# Patient Record
Sex: Female | Born: 1985 | Race: Black or African American | Hispanic: No | Marital: Single | Smoking: Former smoker
Health system: Southern US, Community
[De-identification: ages and names within clinical notes are randomized; demographics above are authoritative.]

## PROBLEM LIST (undated history)

## (undated) ENCOUNTER — Inpatient Hospital Stay (HOSPITAL_COMMUNITY): Payer: Self-pay

## (undated) DIAGNOSIS — A749 Chlamydial infection, unspecified: Secondary | ICD-10-CM

## (undated) DIAGNOSIS — J302 Other seasonal allergic rhinitis: Secondary | ICD-10-CM

## (undated) HISTORY — PX: APPENDECTOMY: SHX54

---
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
## 2006-01-02 ENCOUNTER — Ambulatory Visit (HOSPITAL_COMMUNITY): Payer: Self-pay | Admitting: Psychiatry

## 2006-07-05 ENCOUNTER — Inpatient Hospital Stay (HOSPITAL_COMMUNITY): Admission: AD | Admit: 2006-07-05 | Discharge: 2006-07-07 | Payer: Self-pay | Admitting: Family Medicine

## 2006-07-11 ENCOUNTER — Emergency Department (HOSPITAL_COMMUNITY): Admission: EM | Admit: 2006-07-11 | Discharge: 2006-07-11 | Payer: Self-pay | Admitting: Emergency Medicine

## 2006-09-25 ENCOUNTER — Emergency Department (HOSPITAL_COMMUNITY): Admission: EM | Admit: 2006-09-25 | Discharge: 2006-09-25 | Payer: Self-pay | Admitting: Family Medicine

## 2006-10-22 ENCOUNTER — Emergency Department (HOSPITAL_COMMUNITY): Admission: EM | Admit: 2006-10-22 | Discharge: 2006-10-22 | Payer: Self-pay | Admitting: Emergency Medicine

## 2006-11-27 ENCOUNTER — Emergency Department (HOSPITAL_COMMUNITY): Admission: EM | Admit: 2006-11-27 | Discharge: 2006-11-27 | Payer: Self-pay | Admitting: Family Medicine

## 2007-01-07 ENCOUNTER — Emergency Department (HOSPITAL_COMMUNITY): Admission: EM | Admit: 2007-01-07 | Discharge: 2007-01-07 | Payer: Self-pay | Admitting: Family Medicine

## 2008-05-13 ENCOUNTER — Inpatient Hospital Stay (HOSPITAL_COMMUNITY): Admission: AD | Admit: 2008-05-13 | Discharge: 2008-05-13 | Payer: Self-pay | Admitting: Obstetrics

## 2008-06-03 ENCOUNTER — Inpatient Hospital Stay (HOSPITAL_COMMUNITY): Admission: AD | Admit: 2008-06-03 | Discharge: 2008-06-03 | Payer: Self-pay | Admitting: Registered Nurse

## 2008-06-04 ENCOUNTER — Inpatient Hospital Stay (HOSPITAL_COMMUNITY): Admission: AD | Admit: 2008-06-04 | Discharge: 2008-06-07 | Payer: Self-pay | Admitting: Obstetrics

## 2008-06-04 ENCOUNTER — Encounter: Payer: Self-pay | Admitting: Obstetrics

## 2008-12-28 ENCOUNTER — Emergency Department (HOSPITAL_COMMUNITY): Admission: EM | Admit: 2008-12-28 | Discharge: 2008-12-29 | Payer: Self-pay | Admitting: Emergency Medicine

## 2009-06-03 ENCOUNTER — Emergency Department (HOSPITAL_COMMUNITY): Admission: EM | Admit: 2009-06-03 | Discharge: 2009-06-03 | Payer: Self-pay | Admitting: Family Medicine

## 2009-08-28 ENCOUNTER — Emergency Department (HOSPITAL_COMMUNITY): Admission: EM | Admit: 2009-08-28 | Discharge: 2009-08-28 | Payer: Self-pay | Admitting: Family Medicine

## 2010-01-08 ENCOUNTER — Emergency Department (HOSPITAL_COMMUNITY): Admission: EM | Admit: 2010-01-08 | Discharge: 2010-01-08 | Payer: Self-pay | Admitting: Family Medicine

## 2010-07-10 ENCOUNTER — Emergency Department (HOSPITAL_COMMUNITY): Admission: EM | Admit: 2010-07-10 | Discharge: 2010-07-10 | Payer: Self-pay | Admitting: Family Medicine

## 2010-09-16 ENCOUNTER — Other Ambulatory Visit
Admission: RE | Admit: 2010-09-16 | Discharge: 2010-09-16 | Payer: Self-pay | Source: Home / Self Care | Admitting: Family Medicine

## 2010-11-23 LAB — POCT URINALYSIS DIPSTICK
Nitrite: NEGATIVE
Protein, ur: NEGATIVE mg/dL
Urobilinogen, UA: 0.2 mg/dL (ref 0.0–1.0)
pH: 6 (ref 5.0–8.0)

## 2010-11-23 LAB — POCT I-STAT, CHEM 8
Calcium, Ion: 0.97 mmol/L — ABNORMAL LOW (ref 1.12–1.32)
Chloride: 108 mEq/L (ref 96–112)
Glucose, Bld: 88 mg/dL (ref 70–99)
HCT: 48 % — ABNORMAL HIGH (ref 36.0–46.0)
TCO2: 23 mmol/L (ref 0–100)

## 2010-11-23 LAB — WET PREP, GENITAL: Yeast Wet Prep HPF POC: NONE SEEN

## 2010-11-23 LAB — POCT PREGNANCY, URINE: Preg Test, Ur: NEGATIVE

## 2010-12-12 LAB — POCT RAPID STREP A (OFFICE): Streptococcus, Group A Screen (Direct): NEGATIVE

## 2010-12-16 LAB — GC/CHLAMYDIA PROBE AMP, GENITAL
Chlamydia, DNA Probe: NEGATIVE
GC Probe Amp, Genital: NEGATIVE

## 2010-12-16 LAB — POCT PREGNANCY, URINE: Preg Test, Ur: NEGATIVE

## 2010-12-16 LAB — POCT URINALYSIS DIP (DEVICE)
Bilirubin Urine: NEGATIVE
Glucose, UA: NEGATIVE mg/dL
Hgb urine dipstick: NEGATIVE
Nitrite: NEGATIVE
Specific Gravity, Urine: 1.02 (ref 1.005–1.030)
Urobilinogen, UA: 0.2 mg/dL (ref 0.0–1.0)

## 2010-12-18 ENCOUNTER — Inpatient Hospital Stay (INDEPENDENT_AMBULATORY_CARE_PROVIDER_SITE_OTHER)
Admission: RE | Admit: 2010-12-18 | Discharge: 2010-12-18 | Disposition: A | Discharge status: Home / Self Care | Payer: Medicaid Other | Source: Ambulatory Visit | Attending: Emergency Medicine | Admitting: Emergency Medicine

## 2010-12-18 DIAGNOSIS — J309 Allergic rhinitis, unspecified: Secondary | ICD-10-CM

## 2010-12-18 DIAGNOSIS — J019 Acute sinusitis, unspecified: Secondary | ICD-10-CM

## 2011-01-24 NOTE — Op Note (Signed)
[name], [name]          ACCOUNT NO.:  [id number]   MEDICAL RECORD NO.:  [id number]          PATIENT TYPE:  INP   LOCATION:  9320                          FACILITY:  [facility]   PHYSICIAN:  Charles A. Clearance Coots, M.D.DATE OF BIRTH:  11-17-1985   DATE OF PROCEDURE:  06/04/2008  DATE OF DISCHARGE:                               OPERATIVE REPORT   PREOPERATIVE DIAGNOSES:  1. Thirty two weeks' gestation.  2. Active labor.  3. Transverse lie.   POSTOPERATIVE DIAGNOSES:  1. Thirty two weeks' gestation.  2. Active labor.  3. Transverse lie.   PROCEDURE:  Primary low-transverse cesarean section.   SURGEON:  Charles A. Clearance Coots, MD   ANESTHESIA:  Spinal.   ESTIMATED BLOOD LOSS:  850 mL   COMPLICATIONS:  None.  Foley to gravity.   FINDINGS:  A viable female at 1438, Apgars of 2 at one minute and 8 at  five minutes, cord pH of 7.30.  Normal uterus, ovaries, and fallopian  tubes.   SPECIMEN:  Placenta.   DISPOSITION OF SPECIMEN:  Pathology.   OPERATION:  The patient was brought to the operating room and after  satisfactory spinal anesthesia, the abdomen was prepped and draped in  the usual sterile fashion.  Pfannenstiel skin incision was made with the  scalpel that was deepened down to the fascia with a scalpel.  Fascia was  nicked in the midline and the fascial incision was extended to left and  to the right with curved Mayo scissors.  The superior and inferior  fascial edges were taken off the rectus muscles with blunt and sharp  dissection.  The rectus muscle was bluntly divided in the midline.  Peritoneum was entered digitally and was digitally extended to left and  to the right.  The bladder blade was positioned in the incision.  The  vesicouterine fold of peritoneum above the reflection of the urinary  bladder was grasped with forceps and was incised and undermined with  Metzenbaum scissors.  The incision was extended to left and to the right  with the Metzenbaum scissors.  A  bladder flap was bluntly developed and  the bladder blade was repositioned from the urinary bladder placing it  well out of the operative field.  The uterus was then entered  transversely in the lower uterine segment with the scalpel.  The uterine  incision was extended to left and to the right with bandage scissors.  The delivery was then accomplished in a routine fashion as a footling  breech delivery.  The infant's mouth and nose were suctioned with a  suction bulb and the umbilical cord was doubly clamped and cut and the  infant was handed off to the nursery staff.  Cord pH and cord blood were  obtained and the placenta was spontaneously expelled from the uterine  cavity intact.  The endometrial surface was thoroughly debrided with a  dry lap sponge.  The edges of the uterine incision were grasped with  ring forceps and the uterus was closed with a continuous interlocking  suture of 0 Monocryl from each corner to the center.  Hemostasis was  excellent.  Pelvic cavity was thoroughly irrigated with warm saline  solution and all clots were removed.  The abdomen was then closed as  follows, the peritoneum was closed with a continuous suture of 2-0  Monocryl.  The fascia was closed with continuous suture of 0 Vicryl.  Subcutaneous tissue was thoroughly irrigated with warm saline solution.  All areas of subcutaneous bleeding were coagulated with the Bovie.  The  skin was then approximated with surgical stainless steel staples.  A  sterile bandage was applied to the incision closure.  Surgical  technician indicated that all needle, sponge, and instrument counts were  correct x2.  The patient tolerated the procedure well, transported to  the recovery room in satisfactory condition.      Charles A. Clearance Coots, M.D.  Electronically Signed     CAH/MEDQ  D:  06/04/2008  T:  06/05/2008  Job:  [id number]

## 2011-01-27 NOTE — Discharge Summary (Signed)
[place]  Patient:    GLEE, LASHOMB Visit Number: [id number] MRN: [id number]          Service Type: PED Location: PEDS 6122 01 Attending Physician:  Leonia Corona Dictated by:   Judie Petit. Leonia Corona, M.D. Admit Date:  08/10/2001 Disc. Date: 08/12/01                             Discharge Summary  DIAGNOSIS ON ADMISSION:  Acute appendicitis.  DISCHARGE AT DISCHARGE:  Acute appendicitis, status post appendectomy.  BRIEF HISTORY AND PHYSICAL:  This 25 year old female was admitted through the emergency room at [facility] on August 10, 2001, for abdominal pain of 12 hours duration associated with nausea and vomiting and low grade fever.  Physical examination was consistent with right lower quadrant pain indicating possibility of acute appendicitis.  Lab results showed high wbc count with left shift confirming the probability of acute appendicitis.  The patient was transferred to [facility] and operated immediately for acute appendicitis.  HOSPITAL COURSE:  An open appendectomy was performed.  The patient had a severely inflamed appendix which was removed.  Postoperative care was given by keeping her NPO on the first day and then starting liquids on the next day. She was covered with perioperative antibiotics consisting of Unasyn 1.5 g for 24 hours.  Pain medication included morphine for the first 24 hours followed by Tylenol with codeine.  On the day of discharge, on second postoperative day, the patient tolerated soft diet and was ambulatory and pain was in good control with pain medication. The incision was clean, dry and intact.  The patient is discharged with pain medication with instructions to follow up in my office in one weeks time. Dictated by:   Judie Petit. Leonia Corona, M.D. Attending Physician:  Leonia Corona DD:  08/12/01 TD:  08/12/01 Job: [id number]
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
## 2011-01-27 NOTE — Discharge Summary (Signed)
[name], [name] NO.:  [id number]   MEDICAL RECORD NO.:  [id number]          PATIENT TYPE:  INP   LOCATION:  9320                          FACILITY:  [facility]   PHYSICIAN:  Kathreen Cosier, M.D.DATE OF BIRTH:  December 26, 1985   DATE OF ADMISSION:  06/04/2008  DATE OF DISCHARGE:  06/07/2008                               DISCHARGE SUMMARY   The patient is a 25 year old gravida 2, para 1-0-0-1 whose due date was  July 27, 2008, and she was admitted with premature contractions on  June 03, 2008.  Because of premature contractions, she had been on  Procardia XL 30 at home, where she started contracting again tonight and  the cervix was 2 cm, 80% membranes intact, and she was brought in for  magnesium sulfate 4 g loading 2 g per hour.  The patient subsequently  went into labor and had a transverse lie and underwent a primary low  transverse cesarean section because of a transverse lie in labor at 32  weeks.  Postoperatively, she did well.  She was discharged on the third  postoperative day ambulatory, on a regular diet, to see me in 6 weeks.   DISCHARGE DIAGNOSIS:  Status post premature labor at 32 weeks with  transverse lie and low transverse cesarean section.           ______________________________  Kathreen Cosier, M.D.     BAM/MEDQ  D:  07/01/2008  T:  07/01/2008  Job:  [id number]

## 2011-01-27 NOTE — Op Note (Signed)
[place]. [facility]  Patient:    [name], [name] Visit Number: [id number] MRN: [id number]          Service Type: PED Location: PEDS 6122 01 Attending Physician:  Leonia Corona Dictated by:   Judie Petit. Leonia Corona, M.D. Proc. Date: 08/10/01 Admit Date:  08/10/2001                             Operative Report  PREOPERATIVE DIAGNOSIS:  Acute appendicitis.  POSTOPERATIVE DIAGNOSIS:  Acute appendicitis.  PROCEDURE:  Open appendectomy.  SURGEON: Nelida Meuse, M.D.  ASSISTANT:  Nurse.  ANESTHESIA:  General endotracheal tube anesthesia.  DESCRIPTION OF PROCEDURE:  The patient is brought into operating room and placed supine on operating table.  General endotracheal tube anesthesia is given.  The right lower quadrant of the abdominal wall and the surrounding area is cleaned, prepped, and draped in the usual manner.  A skin incision is centered at the McBurneys point, extending for about 1.52 cm on either side along the skin crease.  The incision is deepened through the subcutaneous tissue using electrocautery until the external aponeurosis is exposed.  This is incised in the line of its fibers with the help of a knife and a scissors. The internal oblique and transversus abdominis muscle fibers are split along its fibers with the help of a blunt-tip hemostat and then splitting the fibers with the blades of the Army-Navy retractors until the area of the peritoneum was exposed.  The peritoneum is held up with two hemostats and incised in between with the help of scissors.  Straw-colored fluid exuded immediately upon opening the peritoneum.  The peritoneal opening is extended on either side by inserting the blade of the scissors and dividing the peritoneum.  The blades of the retractor were inserted in the peritoneal cavity and stretched. The underlying structures appeared to be cecum, as identified with the help of taeniae, which is held up with  a Babcock forceps and followed along the taeniae until the very inflamed, swollen, stump appendix was noted in the pelvic position.  The appendix was held up with a Babcock forceps and delivered through the incision.  With the help of a blunt dissection using the right index finger, the mobilization of the cecum was done and then the appendix around the cecum was delivered through the incision.  The mesoappendix was divided between two clamps and ligated using 3-0 silk until the base of the appendix was cleared.  The base of the appendix was crushed with a straight clamp and clamped above the base.  The base was ligated using 2-0 Vicryl and then the appendix divided with a clamp and removed from the field.  The mucosa of the appendiceal stump was touched with cautery, and then the stump was buried by taking a pursestring suture using 3-0 silk and then tying to bury the stump.  The cecum was returned back into the peritoneal cavity and then the local irrigation of the peritoneal cavity was done using plenty of warm saline until the returning fluid was completely clear, without any evidence of oozing, bleeding, or collection into the peritoneal cavity. Once again showing complete hemostasis, the peritoneum was closed with 2-0 Vicryl running stitch.  The internal oblique and transversus abdominis muscles were approximated using 2-0 Vicryl interrupted stitch.  The external oblique aponeurosis is repaired using interrupted stitch with 2-0 Vicryl, and the wound is once again irrigated.  Approximately 10  cc of 0.25% Marcaine with epinephrine was infiltrated in and around the incision for postoperative pain control.  The wound is closed with 4-0 Monocryl subcuticular stitch. Steri-Strips were applied, which was covered with a sterile gauze and Tegaderm dressing.  The patient tolerated the procedure very well, which was smooth and uneventful.  The patient was later extubated and transported to the  recovery room in good and stable condition. Dictated by:   Judie Petit. Leonia Corona, M.D. Attending Physician:  Leonia Corona DD:  08/12/01 TD:  08/12/01 Job: [id number]

## 2011-06-12 LAB — CBC
MCHC: 33.6
MCHC: 34
Platelets: 208
RDW: 13.1
RDW: 13.4

## 2011-06-12 LAB — RPR: RPR Ser Ql: NONREACTIVE

## 2011-06-12 LAB — CREATININE, SERUM
Creatinine, Ser: 0.55
GFR calc Af Amer: >60
GFR calc non Af Amer: >60

## 2011-06-14 LAB — WET PREP, GENITAL
Trich, Wet Prep: NONE SEEN
Yeast Wet Prep HPF POC: NONE SEEN

## 2012-01-02 ENCOUNTER — Emergency Department (INDEPENDENT_AMBULATORY_CARE_PROVIDER_SITE_OTHER)
Admission: EM | Admit: 2012-01-02 | Discharge: 2012-01-02 | Disposition: A | Discharge status: Nursing Home (Includes ICF) | Payer: Self-pay | Source: Home / Self Care | Attending: Emergency Medicine | Admitting: Emergency Medicine

## 2012-01-02 ENCOUNTER — Encounter (HOSPITAL_COMMUNITY): Payer: Self-pay | Admitting: *Deleted

## 2012-01-02 ENCOUNTER — Inpatient Hospital Stay (HOSPITAL_COMMUNITY)
Admission: EM | Admit: 2012-01-02 | Discharge: 2012-01-05 | DRG: 781 | Disposition: A | Discharge status: Home / Self Care | Payer: Medicaid Other | Attending: Internal Medicine | Admitting: Internal Medicine

## 2012-01-02 DIAGNOSIS — Z3201 Encounter for pregnancy test, result positive: Secondary | ICD-10-CM

## 2012-01-02 DIAGNOSIS — O2300 Infections of kidney in pregnancy, unspecified trimester: Secondary | ICD-10-CM | POA: Diagnosis present

## 2012-01-02 DIAGNOSIS — B3731 Acute candidiasis of vulva and vagina: Secondary | ICD-10-CM | POA: Diagnosis present

## 2012-01-02 DIAGNOSIS — Z331 Pregnant state, incidental: Secondary | ICD-10-CM

## 2012-01-02 DIAGNOSIS — N10 Acute tubulo-interstitial nephritis: Secondary | ICD-10-CM

## 2012-01-02 DIAGNOSIS — B373 Candidiasis of vulva and vagina: Secondary | ICD-10-CM | POA: Diagnosis present

## 2012-01-02 DIAGNOSIS — N12 Tubulo-interstitial nephritis, not specified as acute or chronic: Secondary | ICD-10-CM | POA: Diagnosis present

## 2012-01-02 DIAGNOSIS — Z3480 Encounter for supervision of other normal pregnancy, unspecified trimester: Secondary | ICD-10-CM

## 2012-01-02 DIAGNOSIS — K5900 Constipation, unspecified: Secondary | ICD-10-CM | POA: Diagnosis present

## 2012-01-02 DIAGNOSIS — O2390 Unspecified genitourinary tract infection in pregnancy, unspecified trimester: Principal | ICD-10-CM | POA: Diagnosis present

## 2012-01-02 HISTORY — DX: Other seasonal allergic rhinitis: J30.2

## 2012-01-02 LAB — POCT URINALYSIS DIP (DEVICE)
Glucose, UA: NEGATIVE mg/dL
Nitrite: POSITIVE — AB
Urobilinogen, UA: 2 mg/dL — ABNORMAL HIGH (ref 0.0–1.0)

## 2012-01-02 LAB — COMPREHENSIVE METABOLIC PANEL
ALT: 25 U/L (ref 0–35)
AST: 15 U/L (ref 0–37)
Albumin: 3.9 g/dL (ref 3.5–5.2)
Alkaline Phosphatase: 108 U/L (ref 39–117)
BUN: 7 mg/dL (ref 6–23)
CO2: 26 mEq/L (ref 19–32)
Calcium: 9.4 mg/dL (ref 8.4–10.5)
Chloride: 98 mEq/L (ref 96–112)
Creatinine, Ser: 0.89 mg/dL (ref 0.50–1.10)
GFR calc Af Amer: >90 mL/min (ref >90)
GFR calc non Af Amer: 90 mL/min — ABNORMAL LOW (ref >90)
Glucose, Bld: 98 mg/dL (ref 70–99)
Potassium: 3.2 mEq/L — ABNORMAL LOW (ref 3.5–5.1)
Sodium: 137 mEq/L (ref 135–145)
Total Bilirubin: 1.4 mg/dL — ABNORMAL HIGH (ref 0.3–1.2)
Total Protein: 7.7 g/dL (ref 6.0–8.3)

## 2012-01-02 LAB — POCT PREGNANCY, URINE: Preg Test, Ur: POSITIVE — AB

## 2012-01-02 LAB — CBC
HCT: 43.2 % (ref 36.0–46.0)
Hemoglobin: 15.8 g/dL — ABNORMAL HIGH (ref 12.0–15.0)
MCH: 32.1 pg (ref 26.0–34.0)
MCHC: 36.6 g/dL — ABNORMAL HIGH (ref 30.0–36.0)
MCHC: 36.4 g/dL — ABNORMAL HIGH (ref 30.0–36.0)
MCV: 87.8 fL (ref 78.0–100.0)
Platelets: 165 10*3/uL (ref 150–400)
RBC: 4.92 MIL/uL (ref 3.87–5.11)
RDW: 11.9 % (ref 11.5–15.5)
RDW: 11.9 % (ref 11.5–15.5)
WBC: 12 10*3/uL — ABNORMAL HIGH (ref 4.0–10.5)
WBC: 12.8 10*3/uL — ABNORMAL HIGH (ref 4.0–10.5)

## 2012-01-02 LAB — URINALYSIS, ROUTINE W REFLEX MICROSCOPIC
Glucose, UA: NEGATIVE mg/dL
Ketones, ur: 15 mg/dL — AB
Nitrite: POSITIVE — AB
Protein, ur: 30 mg/dL — AB
Specific Gravity, Urine: 1.017 (ref 1.005–1.030)
Urobilinogen, UA: 1 mg/dL (ref 0.0–1.0)
pH: 6 (ref 5.0–8.0)

## 2012-01-02 LAB — DIFFERENTIAL
Basophils Absolute: 0 10*3/uL (ref 0.0–0.1)
Basophils Relative: 0 % (ref 0–1)
Eosinophils Absolute: 0 10*3/uL (ref 0.0–0.7)
Eosinophils Relative: 0 % (ref 0–5)
Lymphocytes Relative: 12 % (ref 12–46)
Lymphs Abs: 1.5 10*3/uL (ref 0.7–4.0)
Monocytes Absolute: 1.2 10*3/uL — ABNORMAL HIGH (ref 0.1–1.0)
Monocytes Relative: 10 % (ref 3–12)
Neutro Abs: 9.3 10*3/uL — ABNORMAL HIGH (ref 1.7–7.7)
Neutrophils Relative %: 78 % — ABNORMAL HIGH (ref 43–77)

## 2012-01-02 LAB — URINE MICROSCOPIC-ADD ON

## 2012-01-02 LAB — HCG, QUANTITATIVE, PREGNANCY: hCG, Beta Chain, Quant, S: 354 m[IU]/mL — ABNORMAL HIGH (ref <5)

## 2012-01-02 LAB — PREGNANCY, URINE: Preg Test, Ur: POSITIVE — AB

## 2012-01-02 LAB — CREATININE, SERUM
Creatinine, Ser: 0.74 mg/dL (ref 0.50–1.10)
GFR calc non Af Amer: >90 mL/min (ref >90)

## 2012-01-02 MED ORDER — FOLIC ACID 1 MG PO TABS
1.0000 mg | ORAL_TABLET | Freq: Every day | ORAL | Status: DC
  Administered 2012-01-03 – 2012-01-05 (×3): 1 mg via ORAL
  Filled 2012-01-02 (×3): qty 1

## 2012-01-02 MED ORDER — ACETAMINOPHEN 325 MG PO TABS
ORAL_TABLET | ORAL | Status: AC
  Filled 2012-01-02: qty 2

## 2012-01-02 MED ORDER — ADULT MULTIVITAMIN W/MINERALS CH
1.0000 | ORAL_TABLET | Freq: Every day | ORAL | Status: DC
  Administered 2012-01-03 – 2012-01-05 (×3): 1 via ORAL
  Filled 2012-01-02 (×3): qty 1

## 2012-01-02 MED ORDER — LORATADINE 10 MG PO TABS
10.0000 mg | ORAL_TABLET | Freq: Every day | ORAL | Status: DC
  Administered 2012-01-03 – 2012-01-05 (×3): 10 mg via ORAL
  Filled 2012-01-02 (×3): qty 1

## 2012-01-02 MED ORDER — DEXTROSE 5 % IV SOLN
1.0000 g | INTRAVENOUS | Status: DC
  Administered 2012-01-03 – 2012-01-04 (×2): 1 g via INTRAVENOUS
  Filled 2012-01-02 (×3): qty 10

## 2012-01-02 MED ORDER — ONDANSETRON HCL 4 MG/2ML IJ SOLN
4.0000 mg | Freq: Once | INTRAMUSCULAR | Status: AC
  Administered 2012-01-02: 4 mg via INTRAVENOUS
  Filled 2012-01-02: qty 2

## 2012-01-02 MED ORDER — ACETAMINOPHEN 650 MG RE SUPP: 650.0000 mg | Freq: Four times a day (QID) | RECTAL | Status: DC | PRN

## 2012-01-02 MED ORDER — ONDANSETRON HCL 4 MG PO TABS
4.0000 mg | ORAL_TABLET | Freq: Four times a day (QID) | ORAL | Status: DC | PRN
  Administered 2012-01-03: 4 mg via ORAL
  Filled 2012-01-02: qty 1

## 2012-01-02 MED ORDER — DEXTROSE 5 % IV SOLN
1.0000 g | Freq: Once | INTRAVENOUS | Status: AC
  Administered 2012-01-02: 1 g via INTRAVENOUS
  Filled 2012-01-02: qty 10

## 2012-01-02 MED ORDER — HEPARIN SODIUM (PORCINE) 5000 UNIT/ML IJ SOLN
5000.0000 [IU] | Freq: Three times a day (TID) | INTRAMUSCULAR | Status: DC
  Administered 2012-01-03 – 2012-01-04 (×5): 5000 [IU] via SUBCUTANEOUS
  Filled 2012-01-02 (×11): qty 1

## 2012-01-02 MED ORDER — ACETAMINOPHEN 325 MG PO TABS
650.0000 mg | ORAL_TABLET | Freq: Four times a day (QID) | ORAL | Status: DC | PRN
  Administered 2012-01-02: 650 mg via ORAL
  Filled 2012-01-02: qty 2

## 2012-01-02 MED ORDER — MORPHINE SULFATE 4 MG/ML IJ SOLN
4.0000 mg | Freq: Once | INTRAMUSCULAR | Status: AC
  Administered 2012-01-02: 4 mg via INTRAVENOUS
  Filled 2012-01-02: qty 1

## 2012-01-02 MED ORDER — SODIUM CHLORIDE 0.9 % IV BOLUS (SEPSIS)
1000.0000 mL | Freq: Once | INTRAVENOUS | Status: AC
  Administered 2012-01-02: 1000 mL via INTRAVENOUS

## 2012-01-02 MED ORDER — POTASSIUM CHLORIDE 10 MEQ/100ML IV SOLN
10.0000 meq | Freq: Once | INTRAVENOUS | Status: AC
  Administered 2012-01-02: 10 meq via INTRAVENOUS
  Filled 2012-01-02: qty 100

## 2012-01-02 MED ORDER — ONDANSETRON HCL 4 MG/2ML IJ SOLN
4.0000 mg | Freq: Four times a day (QID) | INTRAMUSCULAR | Status: DC | PRN
  Administered 2012-01-03 – 2012-01-05 (×2): 4 mg via INTRAVENOUS
  Filled 2012-01-02 (×3): qty 2

## 2012-01-02 NOTE — ED Notes (Signed)
Transfer from [facility] with a sinus infection for one week.. She just found she was preg from [facility] and she was told she had a uti.  lmp march 20th

## 2012-01-02 NOTE — Discharge Instructions (Signed)
We have determined that your problem requires further evaluation in the emergency department.  We will take care of your transport there.  Once at the emergency department, you will be evaluated by a provider and they will order whatever treatment or tests they deem necessary.  We cannot guarantee that they will do any specific test or do any specific treatment.  ° °

## 2012-01-02 NOTE — ED Notes (Signed)
Patient complaining of back pain.  Patient medicated per PRN pain med orders.

## 2012-01-02 NOTE — ED Notes (Signed)
Patient with urinary tract infection and new pregnancy from [facility].  Patient with chills, nausea and vomiting today.  Patient is afebrile, 99.4.

## 2012-01-02 NOTE — ED Provider Notes (Signed)
History     CSN: [id number]  Arrival date & time 01/02/12  1701   First MD Initiated Contact with Patient 01/02/12 1907      Chief Complaint  Patient presents with  . ucc transfer     (Consider location/radiation/quality/duration/timing/severity/associated sxs/prior treatment) HPI  Patient is sent from the [facility] for further evaluation for concern of acute pyelonephritis in the setting of recently diagnosed pregnancy. Patient states that her last menstrual period was March 20 that it was her normal cycle however urine pregnancy at urgent care showed that she was positive for pregnancy. Patient is complaining of a 2 day history of gradual onset right flank pain, gradual onset nausea and vomiting, and a 3 to four-day history of gradual onset dysuria hematuria. Patient states that approximately 2 months ago she was treated with antibiotics for a urinary tract infection with similar symptoms however her symptoms are less severe. Patient states the symptoms are similar to symptoms 2 months ago. She is is also complaining of a few day history of nasal congestion, mild sore throat, and sinus pain. Patient states that with this pregnancy she is G3 P2 L1. Patient goes to see Dr. Renaye Rakers as her primary care provider who also performs yearly Pap smears. She is not followed regularly by an OB/GYN. Patient states she has no known medical problems and takes no medicines on regular basis. Symptoms were gradual onset, persistent, and worsening. She denies aggravating or alleviating factors. She denies any lower abdominal pain or vaginal bleeding. Patient is felt feverish and chilled but she has no recorded fever.  History reviewed. No pertinent past medical history.  History reviewed. No pertinent past surgical history.  No family history on file.  History  Substance Use Topics  . Smoking status: Current Some Day Smoker    Types: Cigarettes  . Smokeless tobacco: Not on file  . Alcohol  Use: No    OB History    Grav Para Term Preterm Abortions TAB SAB Ect Mult Living                  Review of Systems  All other systems reviewed and are negative.    Allergies  Review of patient's allergies indicates no known allergies.  Home Medications   Current Outpatient Rx  Name Route Sig Dispense Refill  . CETIRIZINE HCL 10 MG PO TABS Oral Take 10 mg by mouth daily.    . IBUPROFEN 200 MG PO TABS Oral Take 600 mg by mouth every 8 (eight) hours as needed. For pain.      BP 113/89  Pulse 114  Temp(Src) 99.3 F (37.4 C) (Oral)  Resp 16  SpO2 100%  LMP 11/29/2011  Physical Exam  Nursing note and vitals reviewed. Constitutional: She is oriented to person, place, and time. She appears well-developed and well-nourished. No distress.  HENT:  Head: Normocephalic and atraumatic.  Eyes: Conjunctivae are normal.  Neck: Normal range of motion. Neck supple.  Cardiovascular: Normal rate, regular rhythm, normal heart sounds and intact distal pulses.  Exam reveals no gallop and no friction rub.   No murmur heard. Pulmonary/Chest: Effort normal and breath sounds normal. No respiratory distress. She has no wheezes. She has no rales. She exhibits no tenderness.  Abdominal: Soft. Bowel sounds are normal. She exhibits no distension and no mass. There is tenderness. There is no rebound and no guarding.       moderate right CVA TTP. Mild suprapubic TTP.   Musculoskeletal: Normal  range of motion. She exhibits no edema and no tenderness.  Neurological: She is alert and oriented to person, place, and time.  Skin: Skin is warm and dry. No rash noted. She is not diaphoretic. No erythema.  Psychiatric: She has a normal mood and affect.    ED Course  Procedures (including critical care time)  IV fluids, IV rocephin. IV morphine and zofran   Labs Reviewed  URINALYSIS, ROUTINE W REFLEX MICROSCOPIC - Abnormal; Notable for the following:    Color, Urine BROWN (*) BIOCHEMICALS MAY BE  AFFECTED BY COLOR   APPearance TURBID (*)    Hgb urine dipstick LARGE (*)    Bilirubin Urine SMALL (*)    Ketones, ur 15 (*)    Protein, ur 30 (*)    Nitrite POSITIVE (*)    Leukocytes, UA LARGE (*)    All other components within normal limits  PREGNANCY, URINE - Abnormal; Notable for the following:    Preg Test, Ur POSITIVE (*)    All other components within normal limits  CBC - Abnormal; Notable for the following:    WBC 12.0 (*)    Hemoglobin 15.8 (*)    MCHC 36.6 (*)    All other components within normal limits  DIFFERENTIAL - Abnormal; Notable for the following:    Neutrophils Relative 78 (*)    Neutro Abs 9.3 (*)    Monocytes Absolute 1.2 (*)    All other components within normal limits  COMPREHENSIVE METABOLIC PANEL - Abnormal; Notable for the following:    Potassium 3.2 (*)    Total Bilirubin 1.4 (*)    GFR calc non Af Amer 90 (*)    All other components within normal limits  URINE MICROSCOPIC-ADD ON - Abnormal; Notable for the following:    Squamous Epithelial / LPF MANY (*)    Bacteria, UA MANY (*)    All other components within normal limits  URINE CULTURE  HCG, QUANTITATIVE, PREGNANCY   No results found.   1. Acute pyelonephritis   2. Incidental pregnancy    Case discussed with Dr. Juleen China.   MDM  Do to complicated pyelonephritis because of pregnancy, patient is to be admitted to triad. Dr. Joneen Roach is at bedside to admit.        [place], [place] 01/02/12 2028

## 2012-01-02 NOTE — ED Notes (Signed)
6742-01 Ready 

## 2012-01-02 NOTE — ED Notes (Signed)
Sinus congestion, and chills  x 3 days with nonproductive cough.  Yesterday she had 8 emesis.  She denies diarrhea.   She also has dysuria X 3 days

## 2012-01-02 NOTE — ED Provider Notes (Signed)
Chief Complaint  Patient presents with  . Facial Pain    History of Present Illness:   The patient is a 26 year old female who has had a seven-day history of urinary tract symptoms. She describes dysuria, frequency, urgency, and incontinence. Her urine is brown in color. She's also had chills, nausea and vomiting yesterday and today, and lower abdominal and right CVA pain. She had a urinary tract infection about 2 months ago and was treated with antibiotics but the symptoms have come back. Her last menstrual period was at the end of March. She is sexually active and usually uses condoms, however she did not use them on one occasion.  She also has had a one-week history of nasal congestion with no drainage, postnasal drip, sore throat, and facial pain. She denies any coughing. She has a history of allergies and smokes a third of a pack of cigarettes per day.  Review of Systems:  Other than noted above, the patient denies any of the following symptoms: General:  No fevers, chills, sweats, aches, or fatigue. GI:  No abdominal pain, back pain, nausea, vomiting, diarrhea, or constipation. GU:  No dysuria, frequency, urgency, hematuria, or incontinence. GYN:  No discharge, itching, vulvar pain or lesions, pelvic pain, or abnormal vaginal bleeding.  PMFSH:  Past medical history, family history, social history, meds, and allergies were reviewed.  Physical Exam:   Vital signs:  BP 104/62  Pulse 120  Temp(Src) 97.8 F (36.6 C) (Oral)  Resp 18  SpO2 100%  LMP 11/29/2011 Gen:  Alert, oriented, in no distress. Lungs:  Clear to auscultation, no wheezes, rales or rhonchi. Heart:  Regular rhythm, no gallop or murmer. Abdomen:  Flat and soft. There was diffuse pain to palpation, particularly in the suprapubic area.  No guarding, or rebound.  No hepato-splenomegaly or mass.  Bowel sounds were normally active.  No hernia. She has exquisite right CVA tenderness to percussion. Back:  Right CVA tenderness to  percussion.  Skin:  Clear, warm and dry.  Labs:   Results for orders placed during the hospital encounter of 01/02/12  POCT URINALYSIS DIP (DEVICE)      Component Value Range   Glucose, UA NEGATIVE  NEGATIVE (mg/dL)   Bilirubin Urine SMALL (*) NEGATIVE    Ketones, ur NEGATIVE  NEGATIVE (mg/dL)   Specific Gravity, Urine 1.015  1.005 - 1.030    Hgb urine dipstick LARGE (*) NEGATIVE    pH 5.5  5.0 - 8.0    Protein, ur 100 (*) NEGATIVE (mg/dL)   Urobilinogen, UA 2.0 (*) 0.0 - 1.0 (mg/dL)   Nitrite POSITIVE (*) NEGATIVE    Leukocytes, UA LARGE (*) NEGATIVE   POCT PREGNANCY, URINE      Component Value Range   Preg Test, Ur POSITIVE (*) NEGATIVE      Assessment: The primary encounter diagnosis was Acute pyelonephritis. A diagnosis of Normal pregnancy, repeat was also pertinent to this visit.   Plan:   1.  The following meds were prescribed:   New Prescriptions   No medications on file   2.  The patient was transported via shuttle to the emergency department since she is at high risk with early pregnancy and acute pyelonephritis symptoms.  Reuben Likes, MD 01/02/12 [phone]

## 2012-01-02 NOTE — ED Notes (Signed)
Attempted to call report.  RN unable to take report at this time.  Name and number given to secretary.

## 2012-01-02 NOTE — H&P (Signed)
PCP:   Geraldo Pitter, MD, MD   Chief Complaint:  Nausea and vomiting  HPI: This is a 26 year old female who states approximately 7 days ago she developed burning on urination and foul smelling urine. She went on to develop nausea vomiting 3 days ago as well as right flank pain. The pain was bad and she could not sleep. She denies any fevers but states she had episodes where she was diaphoretic. She finally went to the urgent care clinic tonight. She was on to have pyelonephritis, she was also found to be pregnant test in 4 weeks. A quantitative beta hCG is pending.  Review of Systems: positives bolded  The patient denies anorexia, fever, weight loss,, vision loss, decreased hearing, hoarseness, chest pain, syncope, dyspnea on exertion, peripheral edema, balance deficits, hemoptysis, abdominal pain, melena, hematochezia, severe indigestion/heartburn, hematuria, incontinence, genital sores, muscle weakness, suspicious skin lesions, transient blindness, difficulty walking, depression, unusual weight change, abnormal bleeding, enlarged lymph nodes, angioedema, and breast masses.  Past Medical History: Past Medical History  Diagnosis Date  . Seasonal allergies    Past Surgical History  Procedure Date  . Appendectomy     Medications: Prior to Admission medications   Medication Sig Start Date End Date Taking? Authorizing Provider  cetirizine (ZYRTEC) 10 MG tablet Take 10 mg by mouth daily.   Yes Historical Provider, MD  ibuprofen (ADVIL,MOTRIN) 200 MG tablet Take 600 mg by mouth every 8 (eight) hours as needed. For pain.   Yes Historical Provider, MD    Allergies:  No Known Allergies  Social History:  reports that she has been smoking Cigarettes.  She does not have any smokeless tobacco history on file. She reports that she does not drink alcohol or use illicit drugs.  Family History: No family history on file.  Physical Exam: Filed Vitals:   01/02/12 1724 01/02/12 1921  BP: 112/74  113/89  Pulse: 119 114  Temp: 99.3 F (37.4 C)   TempSrc: Oral   Resp: 18 16  SpO2: 100%     General:  Alert and oriented times three, well developed and nourished, no acute distress Eyes: PERRLA, pink conjunctiva, no scleral icterus ENT: Moist oral mucosa, neck supple, no thyromegaly Lungs: clear to ascultation, no wheeze, no crackles, no use of accessory muscles Cardiovascular: regular rate and rhythm, no regurgitation, no gallops, no murmurs. No carotid bruits, no JVD Abdomen: soft, positive BS, non-tender, non-distended, no organomegaly, not an acute abdomen GU: not examined Neuro: CN II - XII grossly intact, sensation intact Musculoskeletal: strength 5/5 all extremities, no clubbing, cyanosis or edema Skin: no rash, no subcutaneous crepitation, no decubitus Psych: appropriate patient   Labs on Admission:   Basename 01/02/12 1742  NA 137  K 3.2*  CL 98  CO2 26  GLUCOSE 98  BUN 7  CREATININE 0.89  CALCIUM 9.4  MG --  PHOS --    Basename 01/02/12 1742  AST 15  ALT 25  ALKPHOS 108  BILITOT 1.4*  PROT 7.7  ALBUMIN 3.9   No results found for this basename: LIPASE:2,AMYLASE:2 in the last 72 hours  Basename 01/02/12 1742  WBC 12.0*  NEUTROABS 9.3*  HGB 15.8*  HCT 43.2  MCV 87.8  PLT 165   No results found for this basename: CKTOTAL:3,CKMB:3,CKMBINDEX:3,TROPONINI:3 in the last 72 hours No components found with this basename: POCBNP:3 No results found for this basename: DDIMER:2 in the last 72 hours No results found for this basename: HGBA1C:2 in the last 72 hours No results  found for this basename: CHOL:2,HDL:2,LDLCALC:2,TRIG:2,CHOLHDL:2,LDLDIRECT:2 in the last 72 hours No results found for this basename: TSH,T4TOTAL,FREET3,T3FREE,THYROIDAB in the last 72 hours No results found for this basename: VITAMINB12:2,FOLATE:2,FERRITIN:2,TIBC:2,IRON:2,RETICCTPCT:2 in the last 72 hours  Micro Results: No results found for this or any previous visit (from the past 240  hour(s)). Results for MEZTLI, LLANAS (MRN [id number]) as of 01/02/2012 20:50  Ref. Range 01/02/2012 17:44  Color, Urine Latest Range: YELLOW  BROWN (A)  APPearance Latest Range: CLEAR  TURBID (A)  Specific Gravity, Urine Latest Range: 1.005-1.030  1.017  pH Latest Range: 5.0-8.0  6.0  Glucose, UA Latest Range: NEGATIVE mg/dL NEGATIVE  Bilirubin Urine Latest Range: NEGATIVE  SMALL (A)  Ketones, ur Latest Range: NEGATIVE mg/dL 15 (A)  Protein Latest Range: NEGATIVE mg/dL 30 (A)  Urobilinogen, UA Latest Range: 0.0-1.0 mg/dL 1.0  Nitrite Latest Range: NEGATIVE  POSITIVE (A)  Leukocytes, UA Latest Range: NEGATIVE  LARGE (A)  Hgb urine dipstick Latest Range: NEGATIVE  LARGE (A)  Urine-Other No range found MUCOUS PRESENT  WBC, UA Latest Range: <3 WBC/hpf TOO NUMEROUS TO COUNT  RBC / HPF Latest Range: <3 RBC/hpf TOO NUMEROUS TO COUNT  Squamous Epithelial / LPF Latest Range: RARE  MANY (A)  Bacteria, UA Latest Range: RARE  MANY (A)  Preg Test, Ur Latest Range: NEGATIVE  POSITIVE (A)    Radiological Exams on Admission: No results found.  Assessment/Plan Present on Admission:  .Pyelonephritis complicating pregnancy Admit to MedSurg Start antibiotics Rocephin IV daily Anti-emetics as needed  Prenatal vitamins and folic acid started   Full code DVT prophylaxis Team 5/Dr. Domingo Madeira, [place] Lindblad 01/02/2012, 8:43 PM

## 2012-01-03 ENCOUNTER — Encounter (HOSPITAL_COMMUNITY): Payer: Self-pay | Admitting: *Deleted

## 2012-01-03 LAB — BASIC METABOLIC PANEL
BUN: 6 mg/dL (ref 6–23)
Calcium: 9 mg/dL (ref 8.4–10.5)
Creatinine, Ser: 0.89 mg/dL (ref 0.50–1.10)
GFR calc Af Amer: >90 mL/min (ref >90)
GFR calc non Af Amer: 90 mL/min — ABNORMAL LOW (ref >90)

## 2012-01-03 LAB — CBC
MCHC: 35.9 g/dL (ref 30.0–36.0)
Platelets: 144 10*3/uL — ABNORMAL LOW (ref 150–400)
RDW: 12 % (ref 11.5–15.5)

## 2012-01-03 MED ORDER — ACETAMINOPHEN 325 MG PO TABS
650.0000 mg | ORAL_TABLET | ORAL | Status: DC | PRN
  Administered 2012-01-03 (×3): 650 mg via ORAL
  Filled 2012-01-03: qty 2

## 2012-01-03 MED ORDER — ACETAMINOPHEN 650 MG RE SUPP: 650.0000 mg | RECTAL | Status: DC | PRN

## 2012-01-03 MED ORDER — MORPHINE SULFATE 2 MG/ML IJ SOLN
2.0000 mg | INTRAMUSCULAR | Status: DC | PRN
  Administered 2012-01-03 – 2012-01-05 (×3): 2 mg via INTRAVENOUS
  Filled 2012-01-03 (×3): qty 1

## 2012-01-03 MED ORDER — OXYCODONE HCL 5 MG PO TABS
5.0000 mg | ORAL_TABLET | Freq: Three times a day (TID) | ORAL | Status: DC
  Administered 2012-01-03 – 2012-01-05 (×7): 5 mg via ORAL
  Filled 2012-01-03 (×7): qty 1

## 2012-01-03 MED ORDER — DOCUSATE SODIUM 100 MG PO CAPS
100.0000 mg | ORAL_CAPSULE | Freq: Two times a day (BID) | ORAL | Status: DC
  Administered 2012-01-03 – 2012-01-05 (×5): 100 mg via ORAL
  Filled 2012-01-03 (×6): qty 1

## 2012-01-03 MED ORDER — SODIUM CHLORIDE 0.9 % IV SOLN
INTRAVENOUS | Status: DC
  Administered 2012-01-03: 1000 mL via INTRAVENOUS
  Administered 2012-01-03 (×2): via INTRAVENOUS

## 2012-01-03 MED ORDER — RANITIDINE HCL 150 MG/10ML PO SYRP
150.0000 mg | ORAL_SOLUTION | Freq: Once | ORAL | Status: AC
  Administered 2012-01-03: 150 mg via ORAL
  Filled 2012-01-03: qty 10

## 2012-01-03 NOTE — Progress Notes (Signed)
Utilization review completed. [name] 01/03/2012 

## 2012-01-03 NOTE — ED Provider Notes (Signed)
Medical screening examination/treatment/procedure(s) were performed by non-physician practitioner and as supervising physician I was immediately available for consultation/collaboration.  Absalom Aro, MD 01/03/12 0143 

## 2012-01-03 NOTE — Progress Notes (Signed)
Subjective: Constipated. No vomiting this morning . Still has back pain.  Objective: Vital signs in last 24 hours: Temp:  [97.8 F (36.6 C)-101 F (38.3 C)] 101 F (38.3 C) (04/24 0930) Pulse Rate:  [56-120] 98  (04/24 0930) Resp:  [16-18] 18  (04/24 0930) BP: (90-122)/(57-89) 112/75 mmHg (04/24 0930) SpO2:  [99 %-100 %] 100 % (04/24 0930) Weight:  [66.3 kg (146 lb 2.6 oz)] 66.3 kg (146 lb 2.6 oz) (04/23 2217) Weight change:  Last BM Date: 12/30/11  Intake/Output from previous day: 04/23 0701 - 04/24 0700 In: 1000 [I.V.:1000] Out: -  Total I/O In: 360 [P.O.:360] Out: -    Physical Exam: General: Comfortable, alert, communicative, fully oriented, not short of breath at rest.  HEENT:  No clinical pallor, no jaundice, no conjunctival injection or discharge. Hydration status appears fair. NECK:  Supple, JVP not seen, no carotid bruits, no palpable lymphadenopathy, no palpable goiter. CHEST:  Clinically clear to auscultation, no wheezes, no crackles. HEART:  Sounds 1 and 2 heard, normal, regular, no murmurs. ABDOMEN:  Full, soft, no scars, non-tender, no palpable organomegaly, no palpable masses, normal bowel sounds. Has bilateral renal angle punch tenderness, right>left. GENITALIA:  Not examined. LOWER EXTREMITIES:  No pitting edema, palpable peripheral pulses. MUSCULOSKELETAL SYSTEM:  Unremarkable. CENTRAL NERVOUS SYSTEM:  No focal neurologic deficit on gross examination.  Lab Results:  Basename 01/03/12 0605 01/02/12 2241  WBC 10.3 12.8*  HGB 13.3 12.8  HCT 37.0 35.2*  PLT 144* 142*    Basename 01/03/12 0605 01/02/12 2241 01/02/12 1742  NA 139 -- 137  K 3.6 -- 3.2*  CL 105 -- 98  CO2 26 -- 26  GLUCOSE 133* -- 98  BUN 6 -- 7  CREATININE 0.89 0.74 --  CALCIUM 9.0 -- 9.4   No results found for this or any previous visit (from the past 240 hour(s)).   Studies/Results: No results found.  Medications: Scheduled Meds:   . acetaminophen      . cefTRIAXone  (ROCEPHIN)  IV  1 g Intravenous Once  . cefTRIAXone (ROCEPHIN)  IV  1 g Intravenous Q24H  . folic acid  1 mg Oral Daily  . heparin  5,000 Units Subcutaneous Q8H  . loratadine  10 mg Oral Daily  .  morphine injection  4 mg Intravenous Once  . mulitivitamin with minerals  1 tablet Oral Daily  . ondansetron  4 mg Intravenous Once  . potassium chloride  10 mEq Intravenous Once  . sodium chloride  1,000 mL Intravenous Once   Continuous Infusions:  PRN Meds:.acetaminophen, acetaminophen, ondansetron (ZOFRAN) IV, ondansetron, DISCONTD: acetaminophen, DISCONTD: acetaminophen  Assessment/Plan:  Principal Problem:  *Pyelonephritis complicating pregnancy: Patient has acute pyelonephritis, as evidenced by flank pain, vomiting, pyrexia and positive urinary sediment with pyuria and bacteriuria. Now on Rocephin day# 2. No pyrexia, wcc is normal. Continue current treatment and address pain. Active Problems* 1. Early gestation: Antenatal vitamins. Follow up with Dr Gaynell Face, primary GYN on discharge. 2. Constipation. Laxatives. 3. Seasonal Allergies: On anti-histaminics. Stable/asymptomatic.    LOS: 1 day   Maame Dack,CHRISTOPHER 01/03/2012, 9:52 AM

## 2012-01-04 ENCOUNTER — Encounter (HOSPITAL_COMMUNITY): Payer: Self-pay | Admitting: *Deleted

## 2012-01-04 LAB — URINE CULTURE
Colony Count: >=100000
Colony Count: >=100000
Culture  Setup Time: 201304231815

## 2012-01-04 LAB — CBC
Hemoglobin: 12.3 g/dL (ref 12.0–15.0)
MCH: 30.6 pg (ref 26.0–34.0)
RBC: 4.02 MIL/uL (ref 3.87–5.11)
WBC: 6.6 10*3/uL (ref 4.0–10.5)

## 2012-01-04 LAB — BASIC METABOLIC PANEL
BUN: 4 mg/dL — ABNORMAL LOW (ref 6–23)
Creatinine, Ser: 0.74 mg/dL (ref 0.50–1.10)
GFR calc Af Amer: >90 mL/min (ref >90)
GFR calc non Af Amer: >90 mL/min (ref >90)
Sodium: 140 mEq/L (ref 135–145)

## 2012-01-04 MED ORDER — POTASSIUM CHLORIDE CRYS ER 20 MEQ PO TBCR
40.0000 meq | EXTENDED_RELEASE_TABLET | Freq: Once | ORAL | Status: AC
  Administered 2012-01-04: 40 meq via ORAL
  Filled 2012-01-04: qty 2

## 2012-01-04 MED ORDER — SODIUM CHLORIDE 0.9 % IJ SOLN
10.0000 mL | Freq: Two times a day (BID) | INTRAMUSCULAR | Status: DC
  Administered 2012-01-05: 10 mL via INTRAVENOUS

## 2012-01-04 MED ORDER — CLOTRIMAZOLE 1 % VA CREA
1.0000 | TOPICAL_CREAM | Freq: Every day | VAGINAL | Status: DC
  Administered 2012-01-04: 1 via VAGINAL
  Filled 2012-01-04: qty 45

## 2012-01-04 NOTE — Progress Notes (Signed)
Iv site  D/c  Right a/c.  Pt has c/o  Tenderness and   On palpation  A knot was  Felt.    Will continue  To  Monitor  Site   Pain  Med  Adm  For  Discomfort.

## 2012-01-04 NOTE — Progress Notes (Signed)
Subjective: No vomiting since yeaterday. Back pain is less. C/O vaginal pruritus.  Objective: Vital signs in last 24 hours: Temp:  [97.8 F (36.6 C)-100.5 F (38.1 C)] 98 F (36.7 C) (04/25 1000) Pulse Rate:  [79-101] 90  (04/25 1000) Resp:  [18-20] 20  (04/25 1000) BP: (93-129)/(57-74) 118/70 mmHg (04/25 1000) SpO2:  [98 %-100 %] 98 % (04/25 1000) Weight:  [66.4 kg (146 lb 6.2 oz)] 66.4 kg (146 lb 6.2 oz) (04/24 2054) Weight change: 0.1 kg (3.5 oz) Last BM Date: 01/03/12  Intake/Output from previous day: 04/24 0701 - 04/25 0700 In: 3640 [P.O.:1800; I.V.:1840] Out: 1 [Urine:1] Total I/O In: 600 [P.O.:600] Out: 1 [Urine:1]   Physical Exam: General: Comfortable, alert, communicative, fully oriented, not short of breath at rest.  HEENT:  No clinical pallor, no jaundice, no conjunctival injection or discharge. Hydration status appears fair. NECK:  Supple, JVP not seen, no carotid bruits, no palpable lymphadenopathy, no palpable goiter. CHEST:  Clinically clear to auscultation, no wheezes, no crackles. HEART:  Sounds 1 and 2 heard, normal, regular, no murmurs. ABDOMEN:  Full, soft, no scars, non-tender, no palpable organomegaly, no palpable masses, normal bowel sounds. GENITALIA:  Not examined. LOWER EXTREMITIES:  No pitting edema, palpable peripheral pulses. MUSCULOSKELETAL SYSTEM:  Unremarkable. CENTRAL NERVOUS SYSTEM:  No focal neurologic deficit on gross examination.  Lab Results:  Basename 01/04/12 0550 01/03/12 0605  WBC 6.6 10.3  HGB 12.3 13.3  HCT 35.1* 37.0  PLT 133* 144*    Basename 01/04/12 0550 01/03/12 0605  NA 140 139  K 3.1* 3.6  CL 104 105  CO2 25 26  GLUCOSE 130* 133*  BUN 4* 6  CREATININE 0.74 0.89  CALCIUM 8.7 9.0   Recent Results (from the past 240 hour(s))  URINE CULTURE     Status: Normal   Collection Time   01/02/12  4:20 PM      Component Value Range Status Comment   Specimen Description URINE, CLEAN CATCH   Final    Special Requests  NONE   Final    Culture  Setup Time [id number]   Final    Colony Count >=100,000 COLONIES/ML   Final    Culture ESCHERICHIA COLI   Final    Report Status 01/04/2012 FINAL   Final    Organism ID, Bacteria ESCHERICHIA COLI   Final   URINE CULTURE     Status: Normal   Collection Time   01/02/12  5:44 PM      Component Value Range Status Comment   Specimen Description URINE, CLEAN CATCH   Final    Special Requests NONE   Final    Culture  Setup Time [id number]   Final    Colony Count >=100,000 COLONIES/ML   Final    Culture ESCHERICHIA COLI   Final    Report Status 01/04/2012 FINAL   Final    Organism ID, Bacteria ESCHERICHIA COLI   Final   CULTURE, BLOOD (ROUTINE X 2)     Status: Normal (Preliminary result)   Collection Time   01/02/12  9:40 PM      Component Value Range Status Comment   Specimen Description BLOOD RIGHT ARM   Final    Special Requests BOTTLES DRAWN AEROBIC AND ANAEROBIC 10CC   Final    Culture  Setup Time [id number]   Final    Culture     Final    Value:        BLOOD CULTURE RECEIVED NO GROWTH TO DATE  CULTURE WILL BE HELD FOR 5 DAYS BEFORE ISSUING A FINAL NEGATIVE REPORT   Report Status PENDING   Incomplete   CULTURE, BLOOD (ROUTINE X 2)     Status: Normal (Preliminary result)   Collection Time   01/02/12  9:50 PM      Component Value Range Status Comment   Specimen Description BLOOD RIGHT HAND   Final    Special Requests BOTTLES DRAWN AEROBIC ONLY 10CC   Final    Culture  Setup Time [id number]   Final    Culture     Final    Value:        BLOOD CULTURE RECEIVED NO GROWTH TO DATE CULTURE WILL BE HELD FOR 5 DAYS BEFORE ISSUING A FINAL NEGATIVE REPORT   Report Status PENDING   Incomplete      Studies/Results: No results found.  Medications: Scheduled Meds:    . cefTRIAXone (ROCEPHIN)  IV  1 g Intravenous Q24H  . docusate sodium  100 mg Oral BID  . folic acid  1 mg Oral Daily  . heparin  5,000 Units Subcutaneous Q8H  . loratadine  10 mg Oral Daily  .  mulitivitamin with minerals  1 tablet Oral Daily  . oxyCODONE  5 mg Oral TID  . potassium chloride  40 mEq Oral Once  . ranitidine  150 mg Oral Once   Continuous Infusions:    . sodium chloride 1,000 mL (01/03/12 2237)   PRN Meds:.acetaminophen, acetaminophen, morphine injection, ondansetron (ZOFRAN) IV, ondansetron  Assessment/Plan:  Principal Problem:  *Pyelonephritis complicating pregnancy: Patient has acute pyelonephritis, as evidenced by flank pain, vomiting, pyrexia and positive urinary sediment with pyuria and bacteriuria. Now on Rocephin day# 3. Urine cultures revealed E. Coli, sensitive to cephalosporins and quinolones. No pyrexia, wcc is normal. Continue current treatment. Hydration is satisfactory, and patient is tolerating regular diet. Will discontinue iv fluids today. Active Problems* 1. Early gestation: Antenatal vitamins. Follow up with Dr Gaynell Face, primary GYN on discharge. 2. Constipation. Laxatives. Effective. 3. Seasonal Allergies: On anti-histaminics. Stable/asymptomatic. 4. Vulvo-vaginal candidiasis: Topical Gyne-Lotrimin.  Comment: Possible discharge on 01/05/12.   LOS: 2 days   Audre Cenci,CHRISTOPHER 01/04/2012, 2:22 PM

## 2012-01-04 NOTE — Discharge Summary (Signed)
Physician Discharge Summary  Patient ID: [name] MRN: [id number] DOB/AGE: 1986/02/19 26 y.o.  Admit date: 01/02/2012 Discharge date: 01/05/2012  Primary Care Physician:  Geraldo Pitter, MD, MD   Discharge Diagnoses:    Patient Active Problem List  Diagnoses  . Pyelonephritis complicating pregnancy    Medication List  As of 01/05/2012  2:06 PM   STOP taking these medications         ibuprofen 200 MG tablet         TAKE these medications         acetaminophen 325 MG tablet   Commonly known as: TYLENOL   Take 2 tablets (650 mg total) by mouth every 4 (four) hours as needed (or Fever >/= 101).      cephALEXin 500 MG capsule   Commonly known as: KEFLEX   Take 1 capsule (500 mg total) by mouth 2 (two) times daily.      cetirizine 10 MG tablet   Commonly known as: ZYRTEC   Take 10 mg by mouth daily.      clotrimazole 1 % vaginal cream   Commonly known as: GYNE-LOTRIMIN   Place 1 Applicatorful vaginally at bedtime.      folic acid 1 MG tablet   Commonly known as: FOLVITE   Take 1 tablet (1 mg total) by mouth daily.      mulitivitamin with minerals Tabs   Take 1 tablet by mouth daily.             Disposition and Follow-up:  Follow up with primary MD and wit primary GYN.  Consults:  None  None.  Significant Diagnostic Studies:  No results found.  Brief H and P: For complete details, refer to admission H and P. However, in brief, this is a 26 year-old female, presenting with a 7-day history of burning on urination and foul smelling urine. She went on to develop nausea vomiting 4 days later, as well as right flank pain. She finally went to the urgent care clinic tonight, was found to have pyelonephritis and to be [redacted] weeks pregnant. She came to the ED, and was admitted for further evaluation, investigation and management.  Physical Exam: On 01/05/12. General: Comfortable, alert, communicative, fully oriented, not short of breath at rest.  HEENT: No  clinical pallor, no jaundice, no conjunctival injection or discharge. Hydration status appears fair.  NECK: Supple, JVP not seen, no carotid bruits, no palpable lymphadenopathy, no palpable goiter.  CHEST: Clinically clear to auscultation, no wheezes, no crackles.  HEART: Sounds 1 and 2 heard, normal, regular, no murmurs.  ABDOMEN: Full, soft, no scars, non-tender, no palpable organomegaly, no palpable masses, normal bowel sounds.  GENITALIA: Not examined.  LOWER EXTREMITIES: No pitting edema, palpable peripheral pulses.  MUSCULOSKELETAL SYSTEM: Unremarkable.  CENTRAL NERVOUS SYSTEM: No focal neurologic deficit on gross examination.  Hospital Course:  Principal Problem:  *Pyelonephritis complicating pregnancy: Patient had acute pyelonephritis, as evidenced by flank pain, vomiting, pyrexia leukocytosis, and positive urinary sediment with pyuria and bacteriuria. She was managed with iv fluids, antiemetics and iv Rocephin. Clinical response was satisfactory, with rapid amelioration of symptoms. No pyrexia was recorded, wc normalized. Urine cultures revealed E. Coli, sensitive to cephalosporins and quinolones. No pyrexia, wcc is normal. Intravenous fluids were discontinued on 01/04/12, without deleterious effects, and patient will be transitioned to oral Keflex, effective 02/04/12, for a further 7 days of antibiotics.  Active Problems*  1. Early gestation: This was an incidental finding, confirmed by urine pregnancy test.  Patient was placed on antenatal vitamins. She will follow up with Dr Gaynell Face, primary GYN on discharge.  2. Constipation. Patent did have some constipation, likely occasioned by analgesic medication. This was readily addressed with laxatives.  3. Seasonal Allergies: On anti-histaminics. Stable/asymptomatic.  4. Vulvo-vaginal candidiasis: Patient complained of vulval pruritus on 01/04/12. This was felt likely due to vulvo-vaginal candidiasis. She was placed on a 7-day course of topical  Gyne-Lotrimin cream.   Comment: Stable for discharge on 01/05/12.  Time spent on Discharge: 35 mins.  Signed: Ashyr Hedgepath,CHRISTOPHER 01/05/2012, 2:06 PM

## 2012-01-05 LAB — BASIC METABOLIC PANEL
GFR calc Af Amer: >90 mL/min (ref >90)
GFR calc non Af Amer: >90 mL/min (ref >90)
Potassium: 3.6 mEq/L (ref 3.5–5.1)
Sodium: 139 mEq/L (ref 135–145)

## 2012-01-05 LAB — CBC
MCHC: 35.7 g/dL (ref 30.0–36.0)
RDW: 11.9 % (ref 11.5–15.5)

## 2012-01-05 MED ORDER — CLOTRIMAZOLE 1 % VA CREA: 1.0000 | TOPICAL_CREAM | Freq: Every day | VAGINAL | Status: AC

## 2012-01-05 MED ORDER — CEPHALEXIN 500 MG PO CAPS: 500.0000 mg | ORAL_CAPSULE | Freq: Two times a day (BID) | ORAL | Status: AC

## 2012-01-05 MED ORDER — FOLIC ACID 1 MG PO TABS: 1.0000 mg | ORAL_TABLET | Freq: Every day | ORAL | Status: DC

## 2012-01-05 MED ORDER — ACETAMINOPHEN 325 MG PO TABS: 650.0000 mg | ORAL_TABLET | ORAL | Status: DC | PRN

## 2012-01-05 MED ORDER — ADULT MULTIVITAMIN W/MINERALS CH: 1.0000 | ORAL_TABLET | Freq: Every day | ORAL | Status: DC

## 2012-01-05 NOTE — ED Notes (Signed)
Urine culture positive for escherichia coli.  Pt transferred to [facility] ED via shuttle and admitted to hospital for care.

## 2012-01-05 NOTE — Discharge Instructions (Signed)
Please follow up with you Gynecologist.

## 2012-01-09 LAB — CULTURE, BLOOD (ROUTINE X 2): Culture: NO GROWTH

## 2012-04-28 ENCOUNTER — Emergency Department (HOSPITAL_COMMUNITY): Payer: Medicaid Other

## 2012-04-28 ENCOUNTER — Emergency Department (HOSPITAL_COMMUNITY)
Admission: EM | Admit: 2012-04-28 | Discharge: 2012-04-28 | Disposition: A | Discharge status: Home / Self Care | Payer: Medicaid Other | Attending: Emergency Medicine | Admitting: Emergency Medicine

## 2012-04-28 ENCOUNTER — Encounter (HOSPITAL_COMMUNITY): Payer: Self-pay | Admitting: *Deleted

## 2012-04-28 DIAGNOSIS — R0682 Tachypnea, not elsewhere classified: Secondary | ICD-10-CM | POA: Insufficient documentation

## 2012-04-28 DIAGNOSIS — R0602 Shortness of breath: Secondary | ICD-10-CM | POA: Insufficient documentation

## 2012-04-28 DIAGNOSIS — R079 Chest pain, unspecified: Secondary | ICD-10-CM | POA: Insufficient documentation

## 2012-04-28 DIAGNOSIS — F411 Generalized anxiety disorder: Secondary | ICD-10-CM | POA: Insufficient documentation

## 2012-04-28 DIAGNOSIS — R091 Pleurisy: Secondary | ICD-10-CM | POA: Insufficient documentation

## 2012-04-28 DIAGNOSIS — R000 Tachycardia, unspecified: Secondary | ICD-10-CM | POA: Insufficient documentation

## 2012-04-28 DIAGNOSIS — F419 Anxiety disorder, unspecified: Secondary | ICD-10-CM

## 2012-04-28 DIAGNOSIS — F17200 Nicotine dependence, unspecified, uncomplicated: Secondary | ICD-10-CM | POA: Insufficient documentation

## 2012-04-28 LAB — CBC WITH DIFFERENTIAL/PLATELET
Basophils Absolute: 0 K/uL (ref 0.0–0.1)
Basophils Relative: 0 % (ref 0–1)
Eosinophils Absolute: 0.3 K/uL (ref 0.0–0.7)
Eosinophils Relative: 3 % (ref 0–5)
HCT: 42.5 % (ref 36.0–46.0)
Hemoglobin: 15.2 g/dL — ABNORMAL HIGH (ref 12.0–15.0)
Lymphocytes Relative: 22 % (ref 12–46)
Lymphs Abs: 2.3 10*3/uL (ref 0.7–4.0)
MCH: 31.4 pg (ref 26.0–34.0)
MCHC: 35.8 g/dL (ref 30.0–36.0)
MCV: 87.8 fL (ref 78.0–100.0)
Monocytes Absolute: 0.9 K/uL (ref 0.1–1.0)
Monocytes Relative: 9 % (ref 3–12)
Neutro Abs: 6.7 10*3/uL (ref 1.7–7.7)
Neutrophils Relative %: 66 % (ref 43–77)
Platelets: 175 K/uL (ref 150–400)
RBC: 4.84 MIL/uL (ref 3.87–5.11)
RDW: 12 % (ref 11.5–15.5)
WBC: 10.1 10*3/uL (ref 4.0–10.5)

## 2012-04-28 LAB — POCT I-STAT, CHEM 8
Chloride: 104 mEq/L (ref 96–112)
Glucose, Bld: 72 mg/dL (ref 70–99)
HCT: 44 % (ref 36.0–46.0)
Potassium: 3.1 mEq/L — ABNORMAL LOW (ref 3.5–5.1)

## 2012-04-28 MED ORDER — HYDROMORPHONE HCL PF 1 MG/ML IJ SOLN: 1.0000 mg | Freq: Once | INTRAMUSCULAR | Status: DC

## 2012-04-28 MED ORDER — HYDROMORPHONE HCL PF 1 MG/ML IJ SOLN
1.0000 mg | Freq: Once | INTRAMUSCULAR | Status: AC
  Administered 2012-04-28: 1 mg via INTRAVENOUS
  Filled 2012-04-28: qty 1

## 2012-04-28 MED ORDER — IBUPROFEN 800 MG PO TABS: 800.0000 mg | ORAL_TABLET | Freq: Three times a day (TID) | ORAL | Status: AC

## 2012-04-28 MED ORDER — HYDROMORPHONE HCL PF 1 MG/ML IJ SOLN
1.0000 mg | Freq: Once | INTRAMUSCULAR | Status: DC
  Administered 2012-04-28: 1 mg via INTRAVENOUS
  Filled 2012-04-28: qty 1

## 2012-04-28 MED ORDER — IOHEXOL 350 MG/ML SOLN
100.0000 mL | Freq: Once | INTRAVENOUS | Status: AC | PRN
  Administered 2012-04-28: 100 mL via INTRAVENOUS

## 2012-04-28 MED ORDER — SODIUM CHLORIDE 0.9 % IV BOLUS (SEPSIS)
500.0000 mL | Freq: Once | INTRAVENOUS | Status: AC
  Administered 2012-04-28: 500 mL via INTRAVENOUS

## 2012-04-28 NOTE — ED Provider Notes (Signed)
History     CSN: [id number]  Arrival date & time 04/28/12  1911   First MD Initiated Contact with Patient 04/28/12 2026      Chief Complaint  Patient presents with  . Chest Pain    (Consider location/radiation/quality/duration/timing/severity/associated sxs/prior treatment) HPI Comments: 26 y/o female presents with sudden onset right sided chest pain at 7:00 this morning worsening throughout the day. Rates pain 10/10 and states it radiates to her back. Ibuprofen provided no relief. Admits to associated fevers, chills, diaphoresis, palpitations, sob. Denies nausea, vomiting, calf pain or tenderness. Denies any recent long car rides or plane rides. She is an every day smoker and on OCP. Never had chest pain like this before. Denies history of anxiety or any stressful event that recently occurred.  The history is provided by the patient.    Past Medical History  Diagnosis Date  . Seasonal allergies     Past Surgical History  Procedure Date  . Appendectomy     History reviewed. No pertinent family history.  History  Substance Use Topics  . Smoking status: Current Some Day Smoker    Types: Cigarettes  . Smokeless tobacco: Not on file  . Alcohol Use: No    OB History    Grav Para Term Preterm Abortions TAB SAB Ect Mult Living   1               Review of Systems  Constitutional: Positive for fever, chills and diaphoresis.  Respiratory: Positive for shortness of breath.   Cardiovascular: Positive for chest pain and palpitations. Negative for leg swelling.  Gastrointestinal: Negative for nausea and vomiting.  Psychiatric/Behavioral: The patient is not nervous/anxious.     Allergies  Review of patient's allergies indicates no known allergies.  Home Medications  No current outpatient prescriptions on file.  BP 134/78  Pulse 112  Temp 97.9 F (36.6 C) (Oral)  Resp 18  SpO2 100%  LMP 01/29/2012  Breastfeeding? Unknown  Physical Exam  Constitutional: She is  oriented to person, place, and time. She appears well-developed and well-nourished. She appears distressed.  HENT:  Head: Normocephalic and atraumatic.  Eyes: Conjunctivae and EOM are normal. Pupils are equal, round, and reactive to light.  Neck: Neck supple.  Cardiovascular: Regular rhythm, normal heart sounds and intact distal pulses.  Tachycardia present.        Negative Homan's sign  Pulmonary/Chest: Breath sounds normal. Tachypnea noted. She has no decreased breath sounds.  Abdominal: Soft. Normal appearance and bowel sounds are normal. There is no tenderness.  Musculoskeletal:       No calf tenderness  Neurological: She is alert and oriented to person, place, and time.  Skin: Skin is warm and dry. No rash noted. She is not diaphoretic.  Psychiatric: Her mood appears anxious. Her speech is rapid and/or pressured.    ED Course  Procedures (including critical care time)  Labs Reviewed  CBC WITH DIFFERENTIAL - Abnormal; Notable for the following:    Hemoglobin 15.2 (*)     All other components within normal limits  POCT I-STAT, CHEM 8 - Abnormal; Notable for the following:    Potassium 3.1 (*)     All other components within normal limits  POCT I-STAT TROPONIN I   Date: 04/28/2012  Rate: 114  Rhythm: sinus tachycardia  QRS Axis: normal  Intervals: normal  ST/T Wave abnormalities: nonspecific T wave changes  Conduction Disutrbances:none  Narrative Interpretation: no stemi  Old EKG Reviewed: none available  Results for  orders placed during the hospital encounter of 04/28/12  CBC WITH DIFFERENTIAL      Component Value Range   WBC 10.1  4.0 - 10.5 K/uL   RBC 4.84  3.87 - 5.11 MIL/uL   Hemoglobin 15.2 (*) 12.0 - 15.0 g/dL   HCT 16.1  09.6 - 04.5 %   MCV 87.8  78.0 - 100.0 fL   MCH 31.4  26.0 - 34.0 pg   MCHC 35.8  30.0 - 36.0 g/dL   RDW 40.9  81.1 - 91.4 %   Platelets 175  150 - 400 K/uL   Neutrophils Relative 66  43 - 77 %   Neutro Abs 6.7  1.7 - 7.7 K/uL    Lymphocytes Relative 22  12 - 46 %   Lymphs Abs 2.3  0.7 - 4.0 K/uL   Monocytes Relative 9  3 - 12 %   Monocytes Absolute 0.9  0.1 - 1.0 K/uL   Eosinophils Relative 3  0 - 5 %   Eosinophils Absolute 0.3  0.0 - 0.7 K/uL   Basophils Relative 0  0 - 1 %   Basophils Absolute 0.0  0.0 - 0.1 K/uL  POCT I-STAT, CHEM 8      Component Value Range   Sodium 145  135 - 145 mEq/L   Potassium 3.1 (*) 3.5 - 5.1 mEq/L   Chloride 104  96 - 112 mEq/L   BUN 6  6 - 23 mg/dL   Creatinine, Ser 7.82  0.50 - 1.10 mg/dL   Glucose, Bld 72  70 - 99 mg/dL   Calcium, Ion 9.56  2.13 - 1.23 mmol/L   TCO2 26  0 - 100 mmol/L   Hemoglobin 15.0  12.0 - 15.0 g/dL   HCT 08.6  57.8 - 46.9 %  POCT I-STAT TROPONIN I      Component Value Range   Troponin i, poc 0.00  0.00 - 0.08 ng/mL   Comment 3             Dg Chest 2 View  04/28/2012  *RADIOLOGY REPORT*  Clinical Data: Chest pain and shortness of breath.  CHEST - 2 VIEW  Comparison: None.  Findings: Lungs are clear.  Heart size is normal.  No pneumothorax or pleural fluid.  IMPRESSION: Negative chest.  Original Report Authenticated By: Bernadene Bell. Maricela Curet, M.D.   Ct Angio Chest W/cm &/or Wo Cm  04/28/2012  *RADIOLOGY REPORT*  Clinical Data: Chest and back pain starting this morning.  CT ANGIOGRAPHY CHEST  Technique:  Multidetector CT imaging of the chest using the standard protocol during bolus administration of intravenous contrast. Multiplanar reconstructed images including MIPs were obtained and reviewed to evaluate the vascular anatomy.  Contrast: OMNIPAQUE IOHEXOL 350 MG/ML SOLN  Comparison: None.  Findings: Technically adequate study with good opacification of the central and segmental pulmonary arteries.  No focal filling defects are identified.  No evidence of significant pulmonary embolus. Normal heart size.  Normal caliber thoracic aorta.  The soft tissue density in the anterior mediastinum likely representing thymus.  No significant lymphadenopathy in the  chest.  The esophagus is decompressed.  Visualized portions of the upper abdominal organs are grossly unremarkable.  No pleural effusions.  There is focal atelectasis in the right cardiophrenic angle.  No focal airspace disease or consolidation. No pneumothorax.  Airways appear patent.  Normal alignment of the thoracic spine.  IMPRESSION: No evidence of significant pulmonary embolus.  Focal atelectasis in the right cardiophrenic angle.  Original  Report Authenticated By: Marlon Pel, M.D.     1. Pleurisy   2. Anxiety   3. Chest pain       MDM  26 y/o female with sudden onset chest pain and sob. Patient is a smoker and on ocp. No recent travel. Tachycardic with tachypnea. Will scan to r/o PE. 10:27 PM CT scan negative for PE. Patient no longer with palpitations. Still with pain upon deep inspiration. Diagnosis of pleurisy and anxiety. Case discussed with Dr. Oletta Lamas who agrees with plan of care.      Trevor Mace, PA-C 04/28/12 2231

## 2012-04-28 NOTE — ED Notes (Signed)
Pt reports right sided chest pain that started this am radiating to back with diaphoresis and sob. Pt reports feels like her heart is beating fast.

## 2012-04-28 NOTE — ED Notes (Addendum)
Xray called & notified pt ready, pt alert, NAD, mildly anxious, interactive, resps e/u, tachypneic, speaking in clear complete sentences, ambulatory. Pending lab results.

## 2012-04-28 NOTE — ED Provider Notes (Signed)
Medical screening examination/treatment/procedure(s) were performed by non-physician practitioner and as supervising physician I was immediately available for consultation/collaboration.   Gavin Pound. Oletta Lamas, MD 04/28/12 2236

## 2012-09-08 ENCOUNTER — Emergency Department (HOSPITAL_COMMUNITY)
Admission: EM | Admit: 2012-09-08 | Discharge: 2012-09-08 | Disposition: A | Discharge status: Home / Self Care | Payer: Medicaid Other | Source: Home / Self Care | Attending: Family Medicine | Admitting: Family Medicine

## 2013-08-05 ENCOUNTER — Encounter (HOSPITAL_COMMUNITY): Payer: Self-pay | Admitting: Emergency Medicine

## 2013-08-05 ENCOUNTER — Emergency Department (HOSPITAL_COMMUNITY)
Admission: EM | Admit: 2013-08-05 | Discharge: 2013-08-05 | Disposition: A | Discharge status: Home / Self Care | Payer: Medicaid Other | Attending: Emergency Medicine | Admitting: Emergency Medicine

## 2013-08-05 DIAGNOSIS — N644 Mastodynia: Secondary | ICD-10-CM | POA: Insufficient documentation

## 2013-08-05 DIAGNOSIS — F17200 Nicotine dependence, unspecified, uncomplicated: Secondary | ICD-10-CM | POA: Insufficient documentation

## 2013-08-05 LAB — POCT PREGNANCY, URINE: Preg Test, Ur: NEGATIVE

## 2013-08-05 MED ORDER — KETOROLAC TROMETHAMINE 60 MG/2ML IM SOLN
60.0000 mg | Freq: Once | INTRAMUSCULAR | Status: AC
  Administered 2013-08-05: 60 mg via INTRAMUSCULAR
  Filled 2013-08-05: qty 2

## 2013-08-05 MED ORDER — IBUPROFEN 800 MG PO TABS: 800.0000 mg | ORAL_TABLET | Freq: Three times a day (TID) | ORAL | Status: DC

## 2013-08-05 NOTE — ED Notes (Signed)
Discharge instrution given on medicaltion and follow up patient verbalized understanding

## 2013-08-05 NOTE — ED Provider Notes (Signed)
CSN: [id number]     Arrival date & time 08/05/13  0153 History   First MD Initiated Contact with Patient 08/05/13 0211     Chief Complaint  Patient presents with  . Breast Pain    bilateral sore breast   (Consider location/radiation/quality/duration/timing/severity/associated sxs/prior Treatment) HPI History per patient. Bilateral breast pain and soreness onset a few days ago with start of her menses. Patient states she has had these symptoms before with her cycle but never this bad. No breast masses. No discharge. No redness or rash.   She tried Motrin at home yesterday without relief has not taken anything today.   She was previously on Depakote but currently no birth control pills. No abdominal pain. No pelvic pain. No fevers or chills  Past Medical History  Diagnosis Date  . Seasonal allergies    Past Surgical History  Procedure Laterality Date  . Appendectomy     History reviewed. No pertinent family history. History  Substance Use Topics  . Smoking status: Current Some Day Smoker    Types: Cigarettes  . Smokeless tobacco: Not on file  . Alcohol Use: No   OB History   Grav Para Term Preterm Abortions TAB SAB Ect Mult Living   1              Review of Systems  Constitutional: Negative for fever and chills.  Eyes: Negative for visual disturbance.  Respiratory: Negative for shortness of breath.   Cardiovascular: Negative for chest pain.  Gastrointestinal: Negative for vomiting and abdominal pain.  Genitourinary: Negative for dysuria, urgency, frequency and pelvic pain.  Musculoskeletal: Negative for back pain, neck pain and neck stiffness.  Skin: Negative for rash.  Neurological: Negative for headaches.  All other systems reviewed and are negative.    Allergies  Review of patient's allergies indicates no known allergies.  Home Medications   Current Outpatient Rx  Name  Route  Sig  Dispense  Refill  . ibuprofen (ADVIL,MOTRIN) 800 MG tablet   Oral   Take 800  mg by mouth every 8 (eight) hours as needed for moderate pain.         Marland Kitchen penicillin v potassium (VEETID) 500 MG tablet   Oral   Take 500 mg by mouth daily.         Marland Kitchen ibuprofen (ADVIL,MOTRIN) 800 MG tablet   Oral   Take 1 tablet (800 mg total) by mouth 3 (three) times daily.   21 tablet   0    BP 102/76  Pulse 92  Temp(Src) 98.2 F (36.8 C) (Oral)  Resp 16  SpO2 100% Physical Exam  Constitutional: She is oriented to person, place, and time. She appears well-developed and well-nourished.  HENT:  Head: Normocephalic and atraumatic.  Eyes: EOM are normal. Pupils are equal, round, and reactive to light.  Neck: Neck supple.  Cardiovascular: Regular rhythm and intact distal pulses.   Pulmonary/Chest: Effort normal. No respiratory distress.  Chaperoned breast exam no nipple discharge or erythema/increased warmth to touch  Musculoskeletal: Normal range of motion. She exhibits no edema.  Neurological: She is alert and oriented to person, place, and time.  Skin: Skin is warm and dry.    ED Course  Procedures (including critical care time) Labs Review Labs Reviewed  POCT PREGNANCY, URINE   IM toradol  Recheck after pain medications, pain completely resolved and patient requesting to be discharged home. Outpatient referral provided. Recommended continue Motrin as needed and followup with OB/GYN.   MDM  1. Breast pain in female    Improved with medications vital signs and nursing notes reviewed and considered    Sunnie Nielsen, MD 08/05/13 [phone]

## 2013-08-05 NOTE — ED Notes (Signed)
Patient resting comfortably, pt hemodynamically stable,

## 2013-08-05 NOTE — ED Notes (Signed)
Patient has had sore breast for 10days

## 2014-07-13 ENCOUNTER — Encounter (HOSPITAL_COMMUNITY): Payer: Self-pay | Admitting: Emergency Medicine

## 2014-11-06 ENCOUNTER — Emergency Department (HOSPITAL_COMMUNITY)
Admission: EM | Admit: 2014-11-06 | Discharge: 2014-11-06 | Disposition: A | Discharge status: Home / Self Care | Payer: Medicaid Other | Source: Home / Self Care | Attending: Emergency Medicine | Admitting: Emergency Medicine

## 2014-11-06 ENCOUNTER — Encounter (HOSPITAL_COMMUNITY): Payer: Self-pay | Admitting: Emergency Medicine

## 2014-11-06 DIAGNOSIS — R5381 Other malaise: Secondary | ICD-10-CM

## 2014-11-06 DIAGNOSIS — R0981 Nasal congestion: Secondary | ICD-10-CM

## 2014-11-06 DIAGNOSIS — J069 Acute upper respiratory infection, unspecified: Secondary | ICD-10-CM

## 2014-11-06 LAB — POCT RAPID STREP A: STREPTOCOCCUS, GROUP A SCREEN (DIRECT): NEGATIVE

## 2014-11-06 MED ORDER — METHYLPREDNISOLONE ACETATE 80 MG/ML IJ SUSP
80.0000 mg | Freq: Once | INTRAMUSCULAR | Status: AC
  Administered 2014-11-06: 80 mg via INTRAMUSCULAR

## 2014-11-06 MED ORDER — METHYLPREDNISOLONE ACETATE 80 MG/ML IJ SUSP
INTRAMUSCULAR | Status: AC
  Filled 2014-11-06: qty 1

## 2014-11-06 NOTE — Discharge Instructions (Signed)
Viral Infections A viral infection can be caused by different types of viruses.Most viral infections are not serious and resolve on their own. However, some infections may cause severe symptoms and may lead to further complications. SYMPTOMS Viruses can frequently cause:  Minor sore throat.  Aches and pains.  Headaches.  Runny nose.  Different types of rashes.  Watery eyes.  Tiredness.  Cough.  Loss of appetite.  Gastrointestinal infections, resulting in nausea, vomiting, and diarrhea. These symptoms do not respond to antibiotics because the infection is not caused by bacteria. However, you might catch a bacterial infection following the viral infection. This is sometimes called a "superinfection." Symptoms of such a bacterial infection may include:  Worsening sore throat with pus and difficulty swallowing.  Swollen neck glands.  Chills and a high or persistent fever.  Severe headache.  Tenderness over the sinuses.  Persistent overall ill feeling (malaise), muscle aches, and tiredness (fatigue).  Persistent cough.  Yellow, green, or brown mucus production with coughing. HOME CARE INSTRUCTIONS   Only take over-the-counter or prescription medicines for pain, discomfort, diarrhea, or fever as directed by your caregiver.  Drink enough water and fluids to keep your urine clear or pale yellow. Sports drinks can provide valuable electrolytes, sugars, and hydration.  Get plenty of rest and maintain proper nutrition. Soups and broths with crackers or rice are fine. SEEK IMMEDIATE MEDICAL CARE IF:   You have severe headaches, shortness of breath, chest pain, neck pain, or an unusual rash.  You have uncontrolled vomiting, diarrhea, or you are unable to keep down fluids.  You or your child has an oral temperature above 102 F (38.9 C), not controlled by medicine.  Your baby is older than 3 months with a rectal temperature of 102 F (38.9 C) or higher.  Your baby is 133  months old or younger with a rectal temperature of 100.4 F (38 C) or higher. MAKE SURE YOU:   Understand these instructions.  Will watch your condition.  Will get help right away if you are not doing well or get worse. Document Released: 06/07/2005 Document Revised: 11/20/2011 Document Reviewed: 01/02/2011 [facility] Patient Information 2015 [place], [place]. This information is not intended to replace advice given to you by your health care provider. Make sure you discuss any questions you have with your health care provider.  Upper Respiratory Infection, Adult An upper respiratory infection (URI) is also sometimes known as the common cold. The upper respiratory tract includes the nose, sinuses, throat, trachea, and bronchi. Bronchi are the airways leading to the lungs. Most people improve within 1 week, but symptoms can last up to 2 weeks. A residual cough may last even longer.  CAUSES Many different viruses can infect the tissues lining the upper respiratory tract. The tissues become irritated and inflamed and often become very moist. Mucus production is also common. A cold is contagious. You can easily spread the virus to others by oral contact. This includes kissing, sharing a glass, coughing, or sneezing. Touching your mouth or nose and then touching a surface, which is then touched by another person, can also spread the virus. SYMPTOMS  Symptoms typically develop 1 to 3 days after you come in contact with a cold virus. Symptoms vary from person to person. They may include:  Runny nose.  Sneezing.  Nasal congestion.  Sinus irritation.  Sore throat.  Loss of voice (laryngitis).  Cough.  Fatigue.  Muscle aches.  Loss of appetite.  Headache.  Low-grade fever. DIAGNOSIS  You  might diagnose your own cold based on familiar symptoms, since most people get a cold 2 to 3 times a year. Your caregiver can confirm this based on your exam. Most importantly, your caregiver can check  that your symptoms are not due to another disease such as strep throat, sinusitis, pneumonia, asthma, or epiglottitis. Blood tests, throat tests, and X-rays are not necessary to diagnose a common cold, but they may sometimes be helpful in excluding other more serious diseases. Your caregiver will decide if any further tests are required. RISKS AND COMPLICATIONS  You may be at risk for a more severe case of the common cold if you smoke cigarettes, have chronic heart disease (such as heart failure) or lung disease (such as asthma), or if you have a weakened immune system. The very [name] and very old are also at risk for more serious infections. Bacterial sinusitis, middle ear infections, and bacterial pneumonia can complicate the common cold. The common cold can worsen asthma and chronic obstructive pulmonary disease (COPD). Sometimes, these complications can require emergency medical care and may be life-threatening. PREVENTION  The best way to protect against getting a cold is to practice good hygiene. Avoid oral or hand contact with people with cold symptoms. Wash your hands often if contact occurs. There is no clear evidence that vitamin C, vitamin E, echinacea, or exercise reduces the chance of developing a cold. However, it is always recommended to get plenty of rest and practice good nutrition. TREATMENT  Treatment is directed at relieving symptoms. There is no cure. Antibiotics are not effective, because the infection is caused by a virus, not by bacteria. Treatment may include:  Increased fluid intake. Sports drinks offer valuable electrolytes, sugars, and fluids.  Breathing heated mist or steam (vaporizer or shower).  Eating chicken soup or other clear broths, and maintaining good nutrition.  Getting plenty of rest.  Using gargles or lozenges for comfort.  Controlling fevers with ibuprofen or acetaminophen as directed by your caregiver.  Increasing usage of your inhaler if you have  asthma. Zinc gel and zinc lozenges, taken in the first 24 hours of the common cold, can shorten the duration and lessen the severity of symptoms. Pain medicines may help with fever, muscle aches, and throat pain. A variety of non-prescription medicines are available to treat congestion and runny nose. Your caregiver can make recommendations and may suggest nasal or lung inhalers for other symptoms.  HOME CARE INSTRUCTIONS   Only take over-the-counter or prescription medicines for pain, discomfort, or fever as directed by your caregiver.  Use a warm mist humidifier or inhale steam from a shower to increase air moisture. This may keep secretions moist and make it easier to breathe.  Drink enough water and fluids to keep your urine clear or pale yellow.  Rest as needed.  Return to work when your temperature has returned to normal or as your caregiver advises. You may need to stay home longer to avoid infecting others. You can also use a face mask and careful hand washing to prevent spread of the virus. SEEK MEDICAL CARE IF:   After the first few days, you feel you are getting worse rather than better.  You need your caregiver's advice about medicines to control symptoms.  You develop chills, worsening shortness of breath, or brown or red sputum. These may be signs of pneumonia.  You develop yellow or brown nasal discharge or pain in the face, especially when you bend forward. These may be signs of sinusitis.  You develop a fever, swollen neck glands, pain with swallowing, or white areas in the back of your throat. These may be signs of strep throat. SEEK IMMEDIATE MEDICAL CARE IF:   You have a fever.  You develop severe or persistent headache, ear pain, sinus pain, or chest pain.  You develop wheezing, a prolonged cough, cough up blood, or have a change in your usual mucus (if you have chronic lung disease).  You develop sore muscles or a stiff neck. Document Released: 02/21/2001  Document Revised: 11/20/2011 Document Reviewed: 12/03/2013 [facility] Patient Information 2015 [place], [place]. This information is not intended to replace advice given to you by your health care provider. Make sure you discuss any questions you have with your health care provider.    Nice to meet you. Take Motrin  every 8 hours with Mucinex Max Strength as directed. Ok to use Sudafed (sign for at the pharmacy) as directed. All of these will help your symptoms in their on way. Drink a lot of fluids and rest.

## 2014-11-06 NOTE — ED Notes (Signed)
Pt is here today for for a possible sinus infection, pt said that she has felt pressure in her head and ears, pt also said that she has had a sore throat from all of the drainage

## 2014-11-06 NOTE — ED Provider Notes (Signed)
CSN: [id number]     Arrival date & time 11/06/14  1545 History   First MD Initiated Contact with Patient 11/06/14 1700     Chief Complaint  Patient presents with  . Sinusitis   (Consider location/radiation/quality/duration/timing/severity/associated sxs/prior Treatment) HPI Comments: Patient presents today with a 2 day history of malaise, sinus congestion, "head congestion and pressure". Noted sore throat. No fever or chills. No cough. See remainder of ROS.   Patient is a 29 y.o. female presenting with sinusitis. The history is provided by the patient.  Sinusitis Associated symptoms: congestion, ear pain and fatigue   Associated symptoms: no chills and no fever     Past Medical History  Diagnosis Date  . Seasonal allergies    Past Surgical History  Procedure Laterality Date  . Appendectomy     History reviewed. No pertinent family history. History  Substance Use Topics  . Smoking status: Current Some Day Smoker    Types: Cigarettes  . Smokeless tobacco: Not on file  . Alcohol Use: No   OB History    Gravida Para Term Preterm AB TAB SAB Ectopic Multiple Living   1              Review of Systems  Constitutional: Positive for fatigue. Negative for fever and chills.  HENT: Positive for congestion, ear pain, postnasal drip and sinus pressure.   Eyes: Negative.   Respiratory: Negative.   Skin: Negative.   Allergic/Immunologic: Negative.   Psychiatric/Behavioral: Negative.     Allergies  Review of patient's allergies indicates no known allergies.  Home Medications   Prior to Admission medications   Medication Sig Start Date End Date Taking? Authorizing Provider  ibuprofen (ADVIL,MOTRIN) 800 MG tablet Take 800 mg by mouth every 8 (eight) hours as needed for moderate pain.    Historical Provider, MD  ibuprofen (ADVIL,MOTRIN) 800 MG tablet Take 1 tablet (800 mg total) by mouth 3 (three) times daily. 08/05/13   Sunnie NielsenBrian Opitz, MD  penicillin v potassium (VEETID) 500 MG  tablet Take 500 mg by mouth daily.    Historical Provider, MD   BP 116/81 mmHg  Pulse 89  Temp(Src) 98.2 F (36.8 C) (Oral)  Resp 16  SpO2 99%  LMP 11/05/2014 Physical Exam  Constitutional: She is oriented to person, place, and time. She appears well-developed and well-nourished.  Lying on exam table, sat up for exam without problems  HENT:  Head: Normocephalic and atraumatic.  Right Ear: External ear normal.  Left Ear: External ear normal.  Mouth/Throat: No oropharyngeal exudate.  Oropharynx injection, no exudate, Turbinate enlargement and erythema  Eyes: Pupils are equal, round, and reactive to light. Right eye exhibits no discharge. Left eye exhibits no discharge.  Cardiovascular: Normal rate and regular rhythm.   Pulmonary/Chest: Effort normal and breath sounds normal. No respiratory distress. She has no wheezes.  Lymphadenopathy:    She has no cervical adenopathy.  Neurological: She is alert and oriented to person, place, and time.  Skin: Skin is warm and dry. No rash noted.  Psychiatric: Her behavior is normal.  Nursing note and vitals reviewed.   ED Course  Procedures (including critical care time) Labs Review Labs Reviewed  POCT RAPID STREP A ([facility] CARE ONLY)    Imaging Review No results found.   MDM   1. URI (upper respiratory infection)   2. Nasal congestion   3. Malaise    No indication for Antibiotic therapy. In the setting of malaise ok to provide DepoMedrol 80mg   IM. Work note also given. Supportive care with Motrin, Mucinex and Sudafed as directed. F/U if worsens.     Riki Sheer, PA-C 11/06/14 [phone]

## 2014-11-10 LAB — CULTURE, GROUP A STREP: STREP A CULTURE: NEGATIVE

## 2015-03-18 ENCOUNTER — Encounter (HOSPITAL_COMMUNITY): Payer: Self-pay

## 2015-03-18 ENCOUNTER — Inpatient Hospital Stay (HOSPITAL_COMMUNITY)
Admission: AD | Admit: 2015-03-18 | Discharge: 2015-03-18 | Disposition: A | Discharge status: Home / Self Care | Payer: Medicaid Other | Source: Ambulatory Visit | Attending: Family Medicine | Admitting: Family Medicine

## 2015-03-18 DIAGNOSIS — Z3201 Encounter for pregnancy test, result positive: Secondary | ICD-10-CM | POA: Diagnosis not present

## 2015-03-18 DIAGNOSIS — Z3200 Encounter for pregnancy test, result unknown: Secondary | ICD-10-CM | POA: Diagnosis present

## 2015-03-18 LAB — POCT PREGNANCY, URINE: PREG TEST UR: POSITIVE — AB

## 2015-03-18 NOTE — MAU Note (Signed)
Positive pregnancy test at home today.  Denies abdominal pain or vaginal bleeding.  Wants pregnancy verification.  LMP middle of June; only lasted a few days. Has irregular periods.

## 2015-03-18 NOTE — Discharge Instructions (Signed)
Prenatal Care Providers °[facility]    [facility] ° Phone- [phone]     Phone: [phone] °         °[facility]                      [facility] ° @[place]     Phone: [phone] ° Phone: [phone] °        [facility] °[facility]     Phone: [phone] ° Phone: [phone]   °        [facility] °[facility] [place]                hone: [phone] ° Phone: [phone] °        [facility] °Dr. Bernard Marshall      Phone: [phone] ° Phone: [phone] °        [facility] °[facility]                Phone: [phone] ° Women’s Health  ° Phone:[phone]    [facility] ([place]) °         Phone: [phone] °[facility] °  Phone: [phone] °Safe Medications in Pregnancy  ° °Acne: °Benzoyl Peroxide °Salicylic Acid ° °Backache/Headache: °Tylenol: 2 regular strength every 4 hours OR °             2 Extra strength every 6 hours ° °Colds/Coughs/Allergies: °Benadryl (alcohol free) 25 mg every 6 hours as needed °Breath right strips °Claritin °Cepacol throat lozenges °Chloraseptic throat spray °Cold-Eeze- up to three times per day °Cough drops, alcohol free °Flonase (by prescription only) °Guaifenesin °Mucinex °Robitussin DM (plain only, alcohol free) °Saline nasal spray/drops °Sudafed (pseudoephedrine) & Actifed ** use only after [redacted] weeks gestation and if you do not have high blood pressure °Tylenol °Vicks Vaporub °Zinc lozenges °Zyrtec  ° °Constipation: °Colace °Ducolax suppositories °Fleet enema °Glycerin suppositories °Metamucil °Milk of magnesia °Miralax °Senokot °Smooth move tea ° °Diarrhea: °Kaopectate °Imodium A-D ° °*NO pepto Bismol ° °Hemorrhoids: °Anusol °Anusol HC °Preparation H °Tucks ° °Indigestion: °Tums °Maalox °Mylanta °Zantac  °Pepcid ° °Insomnia: °Benadryl (alcohol free) 25mg every 6 hours as needed °Tylenol  PM °Unisom, no Gelcaps ° °Leg Cramps: °Tums °MagGel ° °Nausea/Vomiting:  °Bonine °Dramamine °Emetrol °Ginger extract °Sea bands °Meclizine  °Nausea medication to take during pregnancy:  °Unisom (doxylamine succinate 25 mg tablets) Take one tablet daily at bedtime. If symptoms are not adequately controlled, the dose can be increased to a maximum recommended dose of two tablets daily (1/2 tablet in the morning, 1/2 tablet mid-afternoon and one at bedtime). °Vitamin B6 100mg tablets. Take one tablet twice a day (up to 200 mg per day). ° °Skin Rashes: °Aveeno products °Benadryl cream or 25mg every 6 hours as needed °Calamine Lotion °1% cortisone cream ° °Yeast infection: °Gyne-lotrimin 7 °Monistat 7 ° ° °**If taking multiple medications, please check labels to avoid duplicating the same active ingredients °**take medication as directed on the label °** Do not exceed 4000 mg of tylenol in 24 hours °**Do not take medications that contain aspirin or ibuprofen ° ° °First Trimester of Pregnancy °The first trimester of pregnancy is from week 1 until the end of week 12 (months 1 through 3). A week after a sperm fertilizes an egg, the egg will implant on the wall of the uterus. This embryo will   begin to develop into a baby. Genes from you and your partner are forming the baby. The female genes determine whether the baby is a boy or a girl. At 6-8 weeks, the eyes and face are formed, and the heartbeat can be seen on ultrasound. At the end of 12 weeks, all the baby's organs are formed.  °Now that you are pregnant, you will want to do everything you can to have a healthy baby. Two of the most important things are to get good prenatal care and to follow your health care provider's instructions. Prenatal care is all the medical care you receive before the baby's birth. This care will help prevent, find, and treat any problems during the pregnancy and childbirth. °BODY CHANGES °Your body goes through many changes during pregnancy. The  changes vary from woman to woman.  °· You may gain or lose a couple of pounds at first. °· You may feel sick to your stomach (nauseous) and throw up (vomit). If the vomiting is uncontrollable, call your health care provider. °· You may tire easily. °· You may develop headaches that can be relieved by medicines approved by your health care provider. °· You may urinate more often. Painful urination may mean you have a bladder infection. °· You may develop heartburn as a result of your pregnancy. °· You may develop constipation because certain hormones are causing the muscles that push waste through your intestines to slow down. °· You may develop hemorrhoids or swollen, bulging veins (varicose veins). °· Your breasts may begin to grow larger and become tender. Your nipples may stick out more, and the tissue that surrounds them (areola) may become darker. °· Your gums may bleed and may be sensitive to brushing and flossing. °· Dark spots or blotches (chloasma, mask of pregnancy) may develop on your face. This will likely fade after the baby is born. °· Your menstrual periods will stop. °· You may have a loss of appetite. °· You may develop cravings for certain kinds of food. °· You may have changes in your emotions from day to day, such as being excited to be pregnant or being concerned that something may go wrong with the pregnancy and baby. °· You may have more vivid and strange dreams. °· You may have changes in your hair. These can include thickening of your hair, rapid growth, and changes in texture. Some women also have hair loss during or after pregnancy, or hair that feels dry or thin. Your hair will most likely return to normal after your baby is born. °WHAT TO EXPECT AT YOUR PRENATAL VISITS °During a routine prenatal visit: °· You will be weighed to make sure you and the baby are growing normally. °· Your blood pressure will be taken. °· Your abdomen will be measured to track your baby's growth. °· The fetal  heartbeat will be listened to starting around week 10 or 12 of your pregnancy. °· Test results from any previous visits will be discussed. °Your health care provider may ask you: °· How you are feeling. °· If you are feeling the baby move. °· If you have had any abnormal symptoms, such as leaking fluid, bleeding, severe headaches, or abdominal cramping. °· If you have any questions. °Other tests that may be performed during your first trimester include: °· Blood tests to find your blood type and to check for the presence of any previous infections. They will also be used to check for low iron levels (anemia) and Rh antibodies. Later   in the pregnancy, blood tests for diabetes will be done along with other tests if problems develop. °· Urine tests to check for infections, diabetes, or protein in the urine. °· An ultrasound to confirm the proper growth and development of the baby. °· An amniocentesis to check for possible genetic problems. °· Fetal screens for spina bifida and Down syndrome. °· You may need other tests to make sure you and the baby are doing well. °HOME CARE INSTRUCTIONS  °Medicines °· Follow your health care provider's instructions regarding medicine use. Specific medicines may be either safe or unsafe to take during pregnancy. °· Take your prenatal vitamins as directed. °· If you develop constipation, try taking a stool softener if your health care provider approves. °Diet °· Eat regular, well-balanced meals. Choose a variety of foods, such as meat or vegetable-based protein, fish, milk and low-fat dairy products, vegetables, fruits, and whole grain breads and cereals. Your health care provider will help you determine the amount of weight gain that is right for you. °· Avoid raw meat and uncooked cheese. These carry germs that can cause birth defects in the baby. °· Eating four or five small meals rather than three large meals a day may help relieve nausea and vomiting. If you start to feel nauseous,  eating a few soda crackers can be helpful. Drinking liquids between meals instead of during meals also seems to help nausea and vomiting. °· If you develop constipation, eat more high-fiber foods, such as fresh vegetables or fruit and whole grains. Drink enough fluids to keep your urine clear or pale yellow. °Activity and Exercise °· Exercise only as directed by your health care provider. Exercising will help you: °¨ Control your weight. °¨ Stay in shape. °¨ Be prepared for labor and delivery. °· Experiencing pain or cramping in the lower abdomen or low back is a good sign that you should stop exercising. Check with your health care provider before continuing normal exercises. °· Try to avoid standing for long periods of time. Move your legs often if you must stand in one place for a long time. °· Avoid heavy lifting. °· Wear low-heeled shoes, and practice good posture. °· You may continue to have sex unless your health care provider directs you otherwise. °Relief of Pain or Discomfort °· Wear a good support bra for breast tenderness.   °· Take warm sitz baths to soothe any pain or discomfort caused by hemorrhoids. Use hemorrhoid cream if your health care provider approves.   °· Rest with your legs elevated if you have leg cramps or low back pain. °· If you develop varicose veins in your legs, wear support hose. Elevate your feet for 15 minutes, 3-4 times a day. Limit salt in your diet. °Prenatal Care °· Schedule your prenatal visits by the twelfth week of pregnancy. They are usually scheduled monthly at first, then more often in the last 2 months before delivery. °· Write down your questions. Take them to your prenatal visits. °· Keep all your prenatal visits as directed by your health care provider. °Safety °· Wear your seat belt at all times when driving. °· Make a list of emergency phone numbers, including numbers for family, friends, the hospital, and police and fire departments. °General Tips °· Ask your  health care provider for a referral to a local prenatal education class. Begin classes no later than at the beginning of month 6 of your pregnancy. °· Ask for help if you have counseling or nutritional needs during pregnancy. Your   health care provider can offer advice or refer you to specialists for help with various needs. °· Do not use hot tubs, steam rooms, or saunas. °· Do not douche or use tampons or scented sanitary pads. °· Do not cross your legs for long periods of time. °· Avoid cat litter boxes and soil used by cats. These carry germs that can cause birth defects in the baby and possibly loss of the fetus by miscarriage or stillbirth. °· Avoid all smoking, herbs, alcohol, and medicines not prescribed by your health care provider. Chemicals in these affect the formation and growth of the baby. °· Schedule a dentist appointment. At home, brush your teeth with a soft toothbrush and be gentle when you floss. °SEEK MEDICAL CARE IF:  °· You have dizziness. °· You have mild pelvic cramps, pelvic pressure, or nagging pain in the abdominal area. °· You have persistent nausea, vomiting, or diarrhea. °· You have a bad smelling vaginal discharge. °· You have pain with urination. °· You notice increased swelling in your face, hands, legs, or ankles. °SEEK IMMEDIATE MEDICAL CARE IF:  °· You have a fever. °· You are leaking fluid from your vagina. °· You have spotting or bleeding from your vagina. °· You have severe abdominal cramping or pain. °· You have rapid weight gain or loss. °· You vomit blood or material that looks like coffee grounds. °· You are exposed to German measles and have never had them. °· You are exposed to fifth disease or chickenpox. °· You develop a severe headache. °· You have shortness of breath. °· You have any kind of trauma, such as from a fall or a car accident. °Document Released: 08/22/2001 Document Revised: 01/12/2014 Document Reviewed: 07/08/2013 °ExitCare® Patient Information ©2015  [employer]. This information is not intended to replace advice given to you by your health care provider. Make sure you discuss any questions you have with your health care provider. ° °

## 2015-03-18 NOTE — MAU Provider Note (Signed)
  History     CSN: [id number]  Arrival date and time: 03/18/15 2128   None     Chief Complaint  Patient presents with  . Possible Pregnancy   HPI Comments: [name] is a 29 y.o. G2P0 at 4554w2d who presents today for a pregnancy test. She states that her LMP was at the end of June. She has had a +UPT at home. She denies any abdominal pain or vaginal bleeding at this time.   Possible Pregnancy This is a new problem. The current episode started today. The problem has been unchanged. Pertinent negatives include no abdominal pain, nausea or vomiting. Nothing aggravates the symptoms. She has tried nothing for the symptoms.      Past Medical History  Diagnosis Date  . Seasonal allergies     Past Surgical History  Procedure Laterality Date  . Appendectomy      No family history on file.  History  Substance Use Topics  . Smoking status: Current Some Day Smoker    Types: Cigarettes  . Smokeless tobacco: Not on file  . Alcohol Use: No    Allergies: No Known Allergies  Prescriptions prior to admission  Medication Sig Dispense Refill Last Dose  . ibuprofen (ADVIL,MOTRIN) 800 MG tablet Take 800 mg by mouth every 8 (eight) hours as needed for moderate pain.   Past Week at Unknown time  . ibuprofen (ADVIL,MOTRIN) 800 MG tablet Take 1 tablet (800 mg total) by mouth 3 (three) times daily. 21 tablet 0   . penicillin v potassium (VEETID) 500 MG tablet Take 500 mg by mouth daily.   Past Week at Unknown time    Review of Systems  Gastrointestinal: Negative for nausea, vomiting and abdominal pain.  Genitourinary: Negative for dysuria, urgency and frequency.   Physical Exam   Blood pressure 128/83, pulse 99, temperature 98.8 F (37.1 C), temperature source Oral, resp. rate 16, last menstrual period 02/16/2015, SpO2 100 %, not currently breastfeeding.  Physical Exam  Nursing note and vitals reviewed. Constitutional: She is oriented to person, place, and time. She appears  well-developed and well-nourished. No distress.  HENT:  Head: Normocephalic.  Respiratory: Effort normal.  Neurological: She is alert and oriented to person, place, and time.  Psychiatric: She has a normal mood and affect.   Results for orders placed or performed during the hospital encounter of 03/18/15 (from the past 24 hour(s))  Pregnancy, urine POC     Status: Abnormal   Collection Time: 03/18/15  9:49 PM  Result Value Ref Range   Preg Test, Ur POSITIVE (A) NEGATIVE    MAU Course  Procedures  MDM   Assessment and Plan   1. Encounter for pregnancy test, result positive    DC home 1st Trimester precautions  RX: none  [facility]   Follow-up Information    Schedule an appointment as soon as possible [facility]   Contact information:   [address] [facility] [place] [phone]        Tawnya CrookHogan, Heather Donovan 03/18/2015, 9:52 PM

## 2015-04-09 ENCOUNTER — Inpatient Hospital Stay (HOSPITAL_COMMUNITY)
Admission: AD | Admit: 2015-04-09 | Discharge: 2015-04-10 | Disposition: A | Discharge status: Home / Self Care | Payer: Medicaid Other | Source: Ambulatory Visit | Attending: Family Medicine | Admitting: Family Medicine

## 2015-04-09 ENCOUNTER — Encounter (HOSPITAL_COMMUNITY): Payer: Self-pay | Admitting: *Deleted

## 2015-04-09 DIAGNOSIS — B373 Candidiasis of vulva and vagina: Secondary | ICD-10-CM | POA: Insufficient documentation

## 2015-04-09 DIAGNOSIS — B379 Candidiasis, unspecified: Secondary | ICD-10-CM

## 2015-04-09 DIAGNOSIS — F17210 Nicotine dependence, cigarettes, uncomplicated: Secondary | ICD-10-CM | POA: Insufficient documentation

## 2015-04-09 NOTE — MAU Note (Addendum)
I have itchy yeast infection. Symptoms present for 2 days. White, thick d/c. HAd TAB at [facility] on 7/16 at 5wks

## 2015-04-10 ENCOUNTER — Encounter (HOSPITAL_COMMUNITY): Payer: Self-pay | Admitting: Family

## 2015-04-10 DIAGNOSIS — F17210 Nicotine dependence, cigarettes, uncomplicated: Secondary | ICD-10-CM | POA: Diagnosis not present

## 2015-04-10 DIAGNOSIS — B373 Candidiasis of vulva and vagina: Secondary | ICD-10-CM | POA: Diagnosis not present

## 2015-04-10 DIAGNOSIS — L293 Anogenital pruritus, unspecified: Secondary | ICD-10-CM | POA: Diagnosis present

## 2015-04-10 DIAGNOSIS — L298 Other pruritus: Secondary | ICD-10-CM

## 2015-04-10 LAB — WET PREP, GENITAL
CLUE CELLS WET PREP: NONE SEEN
Trich, Wet Prep: NONE SEEN

## 2015-04-10 MED ORDER — FLUCONAZOLE 150 MG PO TABS: 150.0000 mg | ORAL_TABLET | Freq: Once | ORAL | Status: DC

## 2015-04-10 MED ORDER — TERCONAZOLE 0.4 % VA CREA: 1.0000 | TOPICAL_CREAM | Freq: Every day | VAGINAL | Status: DC

## 2015-04-10 NOTE — MAU Provider Note (Signed)
  History     CSN: [id number]  Arrival date and time: 04/09/15 2321   First Provider Initiated Contact with Patient 04/10/15 0000      Chief Complaint  Patient presents with  . Vaginal Itching   HPI  [name] is a [age] here with report of vaginal itching and thick white discharge x 2 days.   Recently had a elective abortion at [facility] on 03/27/15 and was given prophylactic antibiotics.  Reports an gestational age of [redacted] wks at time of procedure.    Past Medical History  Diagnosis Date  . Seasonal allergies     Past Surgical History  Procedure Laterality Date  . Appendectomy    . Cesarean section      Family History  Problem Relation Age of Onset  . Hypertension Mother   . Hypertension Father   . Diabetes Father   . Hypertension Sister   . Hypertension Brother     History  Substance Use Topics  . Smoking status: Current Some Day Smoker    Types: Cigarettes  . Smokeless tobacco: Not on file  . Alcohol Use: No    Allergies: No Known Allergies  Prescriptions prior to admission  Medication Sig Dispense Refill Last Dose  . diphenhydrAMINE (BENADRYL) 25 MG tablet Take 25 mg by mouth every 6 (six) hours as needed.   04/08/2015 at Unknown time  . ibuprofen (ADVIL,MOTRIN) 800 MG tablet Take 800 mg by mouth every 8 (eight) hours as needed.   04/09/2015 at Unknown time  . penicillin v potassium (VEETID) 500 MG tablet Take 500 mg by mouth daily.   Past Week at Unknown time    Review of Systems  Constitutional: Negative for fever and chills.  Gastrointestinal: Negative for abdominal pain.  Genitourinary: Negative for dysuria and urgency.       White discharge  All other systems reviewed and are negative.  Physical Exam   Blood pressure 122/76, pulse 82, temperature 98.2 F (36.8 C), resp. rate 18, height  (1.6 m), weight 164 lb (74.39 kg), last menstrual period 02/16/2015, unknown if currently breastfeeding.  Physical Exam   Constitutional: She is oriented to person, place, and time. She appears well-developed and well-nourished. No distress.  HENT:  Head: Normocephalic.  Neck: Normal range of motion. Neck supple.  Cardiovascular: Normal rate and regular rhythm.   Respiratory: Effort normal and breath sounds normal.  GI: Soft. There is no tenderness.  Genitourinary: Cervix exhibits no motion tenderness. Vaginal discharge ( thick white cottage cheese appearing discharge) found.  Musculoskeletal: Normal range of motion. She exhibits no edema.  Neurological: She is alert and oriented to person, place, and time.  Skin: Skin is warm and dry.    MAU Course  Procedures Results for orders placed or performed during the hospital encounter of 04/09/15 (from the past 24 hour(s))  Wet prep, genital     Status: Abnormal   Collection Time: 04/10/15 12:05 AM  Result Value Ref Range   Yeast Wet Prep HPF POC FEW (A) NONE SEEN   Trich, Wet Prep NONE SEEN NONE SEEN   Clue Cells Wet Prep HPF POC NONE SEEN NONE SEEN   WBC, Wet Prep HPF POC MANY (A) NONE SEEN    Assessment and Plan  Yeast Infection  Plan: Discharge to home RX Terazol cream RX Diflucan Follow-up prn  Rochele Pages N 04/10/2015, 12:59 AM

## 2015-04-13 ENCOUNTER — Telehealth: Payer: Self-pay | Admitting: General Practice

## 2015-04-13 NOTE — Telephone Encounter (Signed)
Patient called and left message stating she has a question about her results and the few bacteria that was seen. Called patient back and informed her of yeast infection on her wet prep and that we usually do not do anything to treat WBC because that doesn't really give [place] information about a type of infection/bacteria however she already has two medications sent to her pharmacy that will treat the yeast infection or any other bacteria present. Patient verbalized understanding and had no questions

## 2015-07-04 ENCOUNTER — Inpatient Hospital Stay (HOSPITAL_COMMUNITY)
Admission: AD | Admit: 2015-07-04 | Discharge: 2015-07-04 | Disposition: A | Discharge status: Home / Self Care | Payer: Medicaid Other | Source: Ambulatory Visit | Attending: Obstetrics and Gynecology | Admitting: Obstetrics and Gynecology

## 2015-07-04 ENCOUNTER — Encounter (HOSPITAL_COMMUNITY): Payer: Self-pay | Admitting: *Deleted

## 2015-07-04 DIAGNOSIS — N898 Other specified noninflammatory disorders of vagina: Secondary | ICD-10-CM | POA: Diagnosis present

## 2015-07-04 DIAGNOSIS — F17210 Nicotine dependence, cigarettes, uncomplicated: Secondary | ICD-10-CM | POA: Insufficient documentation

## 2015-07-04 DIAGNOSIS — A599 Trichomoniasis, unspecified: Secondary | ICD-10-CM

## 2015-07-04 DIAGNOSIS — A5901 Trichomonal vulvovaginitis: Secondary | ICD-10-CM | POA: Diagnosis not present

## 2015-07-04 HISTORY — DX: Chlamydial infection, unspecified: A74.9

## 2015-07-04 LAB — WET PREP, GENITAL: Yeast Wet Prep HPF POC: NONE SEEN

## 2015-07-04 LAB — POCT PREGNANCY, URINE: PREG TEST UR: NEGATIVE

## 2015-07-04 MED ORDER — METRONIDAZOLE 500 MG PO TABS: ORAL_TABLET | ORAL | Status: DC

## 2015-07-04 NOTE — MAU Note (Signed)
Has a yeast infection again.  Has a d/c and is really itchy.

## 2015-07-04 NOTE — Discharge Instructions (Signed)
Trichomoniasis Trichomoniasis is an infection caused by an organism called Trichomonas. The infection can affect both women and men. In women, the outer female genitalia and the vagina are affected. In men, the penis is mainly affected, but the prostate and other reproductive organs can also be involved. Trichomoniasis is a sexually transmitted infection (STI) and is most often passed to another person through sexual contact.  RISK FACTORS  Having unprotected sexual intercourse.  Having sexual intercourse with an infected partner. SIGNS AND SYMPTOMS  Symptoms of trichomoniasis in women include:  Abnormal gray-green frothy vaginal discharge.  Itching and irritation of the vagina.  Itching and irritation of the area outside the vagina. Symptoms of trichomoniasis in men include:   Penile discharge with or without pain.  Pain during urination. This results from inflammation of the urethra. DIAGNOSIS  Trichomoniasis may be found during a Pap test or physical exam. Your health care provider may use one of the following methods to help diagnose this infection:  Testing the pH of the vagina with a test tape.  Using a vaginal swab test that checks for the Trichomonas organism. A test is available that provides results within a few minutes.  Examining a urine sample.  Testing vaginal secretions. Your health care provider may test you for other STIs, including HIV. TREATMENT   You may be given medicine to fight the infection. Women should inform their health care provider if they could be or are pregnant. Some medicines used to treat the infection should not be taken during pregnancy.  Your health care provider may recommend over-the-counter medicines or creams to decrease itching or irritation.  Your sexual partner will need to be treated if infected.  Your health care provider may test you for infection again 3 months after treatment. HOME CARE INSTRUCTIONS   Take medicines only as  directed by your health care provider.  Take over-the-counter medicine for itching or irritation as directed by your health care provider.  Do not have sexual intercourse while you have the infection.  Women should not douche or wear tampons while they have the infection.  Discuss your infection with your partner. Your partner may have gotten the infection from you, or you may have gotten it from your partner.  Have your sex partner get examined and treated if necessary.  Practice safe, informed, and protected sex.  See your health care provider for other STI testing. SEEK MEDICAL CARE IF:   You still have symptoms after you finish your medicine.  You develop abdominal pain.  You have pain when you urinate.  You have bleeding after sexual intercourse.  You develop a rash.  Your medicine makes you sick or makes you throw up (vomit). MAKE SURE YOU:  Understand these instructions.  Will watch your condition.  Will get help right away if you are not doing well or get worse.   This information is not intended to replace advice given to you by your health care provider. Make sure you discuss any questions you have with your health care provider.   Document Released: 02/21/2001 Document Revised: 09/18/2014 Document Reviewed: 06/09/2013 Elsevier Interactive Patient Education 2016 [employer]  

## 2015-07-04 NOTE — MAU Provider Note (Signed)
History     CSN: [id number]  Arrival date and time: 07/04/15 1548   First Provider Initiated Contact with Patient 07/04/15 1626      Chief Complaint  Patient presents with  . Vaginal Discharge   HPI Comments: [name] is a 29 y.o. [phone] with white pruritc vaginal discharge present for a few days. Onset shortly after menses. Similar to sx she has had in th past with yeast infections. Uses condoms for contraception, but no current partner or recent intercourse.    Vaginal Discharge The patient's primary symptoms include vaginal discharge. Pertinent negatives include no abdominal pain, dysuria, fever, flank pain, frequency, hematuria, urgency or vomiting.    OB History  Gravida Para Term Preterm AB SAB TAB Ectopic Multiple Living  5 2 1 1 3  3   1     # Outcome Date GA Lbr Len/2nd Weight Sex Delivery Anes PTL Lv  5 Preterm 06/04/08    M CS-LTranv   ND  4 Term 07/05/06    F Vag-Spont     3 TAB           2 TAB           1 TAB                Past Medical History  Diagnosis Date  . Seasonal allergies   . Infection     uti  . Chlamydia     age 29    Past Surgical History  Procedure Laterality Date  . Appendectomy    . Cesarean section      Family History  Problem Relation Age of Onset  . Hypertension Mother   . Hypertension Father   . Diabetes Father   . Hypertension Sister   . Asthma Sister   . Hypertension Brother   . Asthma Brother   . Asthma Daughter     Social History  Substance Use Topics  . Smoking status: Current Some Day Smoker -- 0.50 packs/day for 10 years    Types: Cigarettes  . Smokeless tobacco: Never Used  . Alcohol Use: No    Allergies: No Known Allergies  Prescriptions prior to admission  Medication Sig Dispense Refill Last Dose  . diphenhydrAMINE (BENADRYL) 25 MG tablet Take 25 mg by mouth every 6 (six) hours as needed.   04/08/2015 at Unknown time  . fluconazole (DIFLUCAN) 150 MG tablet Take 1 tablet (150 mg total) by  mouth once. 1 tablet 1   . ibuprofen (ADVIL,MOTRIN) 800 MG tablet Take 800 mg by mouth every 8 (eight) hours as needed.   04/09/2015 at Unknown time  . terconazole (TERAZOL 7) 0.4 % vaginal cream Place 1 applicator vaginally at bedtime. 45 g 0     Review of Systems  Constitutional: Negative for fever.  Respiratory: Negative for cough.   Gastrointestinal: Negative for vomiting and abdominal pain.  Genitourinary: Positive for vaginal discharge. Negative for dysuria, urgency, frequency, hematuria and flank pain.   Physical Exam   Blood pressure 119/73, pulse 84, temperature 98.7 F (37.1 C), temperature source Oral, resp. rate 18, last menstrual period 06/26/2015, unknown if currently breastfeeding.  Physical Exam  Nursing note and vitals reviewed. Constitutional: She is oriented to person, place, and time. She appears well-developed and well-nourished.  Eyes: Pupils are equal, round, and reactive to light.  Neck: Normal range of motion.  Cardiovascular: Normal rate and regular rhythm.   Respiratory: Effort normal.  GI: Soft. There is no tenderness.  Genitourinary:  NEFG; Vagina with moderate leukorrhea; cx clean, mobile; uterues not enlarged or tender; no adnexal tenderness or masses  Musculoskeletal: Normal range of motion.  Neurological: She is alert and oriented to person, place, and time.    MAU Course  Procedures   Results for orders placed or performed during the hospital encounter of 07/04/15 (from the past 24 hour(s))  Pregnancy, urine POC     Status: None   Collection Time: 07/04/15  4:09 PM  Result Value Ref Range   Preg Test, Ur NEGATIVE NEGATIVE  Wet prep, genital     Status: Abnormal   Collection Time: 07/04/15  4:48 PM  Result Value Ref Range   Yeast Wet Prep HPF POC NONE SEEN NONE SEEN   Trich, Wet Prep FEW (A) NONE SEEN   Clue Cells Wet Prep HPF POC FEW (A) NONE SEEN   WBC, Wet Prep HPF POC MODERATE (A) NONE SEEN   GC/CT sent    Assessment and Plan    1. Trichimoniasis      Medication List    STOP taking these medications        diphenhydrAMINE 25 MG tablet  Commonly known as:  BENADRYL     fluconazole 150 MG tablet  Commonly known as:  DIFLUCAN     terconazole 0.4 % vaginal cream  Commonly known as:  TERAZOL 7      TAKE these medications        ibuprofen 200 MG tablet  Commonly known as:  ADVIL,MOTRIN  Take 400 mg by mouth every 6 (six) hours as needed for headache or mild pain.     metroNIDAZOLE 500 MG tablet  Commonly known as:  FLAGYL  Tak all 4 tabs at once       [facility] [employer] - contact information given Xavius Spadafore 07/04/2015, 4:27 PM

## 2015-07-05 LAB — GC/CHLAMYDIA PROBE AMP (~~LOC~~) NOT AT ARMC
Chlamydia: NEGATIVE
Neisseria Gonorrhea: NEGATIVE

## 2015-11-04 ENCOUNTER — Emergency Department (HOSPITAL_COMMUNITY)
Admission: EM | Admit: 2015-11-04 | Discharge: 2015-11-04 | Disposition: A | Discharge status: Home / Self Care | Payer: Medicaid Other | Attending: Emergency Medicine | Admitting: Emergency Medicine

## 2015-11-04 ENCOUNTER — Encounter (HOSPITAL_COMMUNITY): Payer: Self-pay | Admitting: Emergency Medicine

## 2015-11-04 DIAGNOSIS — F17210 Nicotine dependence, cigarettes, uncomplicated: Secondary | ICD-10-CM | POA: Diagnosis not present

## 2015-11-04 DIAGNOSIS — R52 Pain, unspecified: Secondary | ICD-10-CM | POA: Insufficient documentation

## 2015-11-04 DIAGNOSIS — R112 Nausea with vomiting, unspecified: Secondary | ICD-10-CM | POA: Diagnosis not present

## 2015-11-04 DIAGNOSIS — R531 Weakness: Secondary | ICD-10-CM | POA: Diagnosis not present

## 2015-11-04 NOTE — ED Notes (Signed)
Pt brought in by EMS with c/o nausea, vomiting, general body aches and general weakness  Pt states her sxs started 10 days ago  EMS started an IV and gave Zofran  IV in route with some relief

## 2015-11-04 NOTE — ED Notes (Signed)
Pt states that she is leaving and will follow up with her doctor in the morning.

## 2016-01-24 ENCOUNTER — Encounter: Payer: Self-pay | Admitting: Allergy and Immunology

## 2016-01-24 ENCOUNTER — Ambulatory Visit (INDEPENDENT_AMBULATORY_CARE_PROVIDER_SITE_OTHER): Payer: Medicaid Other | Admitting: Allergy and Immunology

## 2016-01-24 VITALS — BP 110/70 | HR 72 | Resp 16

## 2016-01-24 DIAGNOSIS — J019 Acute sinusitis, unspecified: Secondary | ICD-10-CM | POA: Insufficient documentation

## 2016-01-24 DIAGNOSIS — J0100 Acute maxillary sinusitis, unspecified: Secondary | ICD-10-CM

## 2016-01-24 DIAGNOSIS — H1010 Acute atopic conjunctivitis, unspecified eye: Secondary | ICD-10-CM

## 2016-01-24 DIAGNOSIS — R062 Wheezing: Secondary | ICD-10-CM | POA: Diagnosis not present

## 2016-01-24 DIAGNOSIS — J3089 Other allergic rhinitis: Secondary | ICD-10-CM | POA: Diagnosis not present

## 2016-01-24 DIAGNOSIS — H1045 Other chronic allergic conjunctivitis: Secondary | ICD-10-CM | POA: Diagnosis not present

## 2016-01-24 HISTORY — DX: Wheezing: R06.2

## 2016-01-24 HISTORY — DX: Acute sinusitis, unspecified: J01.90

## 2016-01-24 MED ORDER — PREDNISONE 1 MG PO TABS: 10.0000 mg | ORAL_TABLET | ORAL | Status: DC

## 2016-01-24 MED ORDER — OLOPATADINE HCL 0.2 % OP SOLN: 1.0000 [drp] | OPHTHALMIC | Status: DC

## 2016-01-24 MED ORDER — ALBUTEROL SULFATE HFA 108 (90 BASE) MCG/ACT IN AERS: 2.0000 | INHALATION_SPRAY | RESPIRATORY_TRACT | Status: DC | PRN

## 2016-01-24 MED ORDER — LEVOCETIRIZINE DIHYDROCHLORIDE 5 MG PO TABS
5.0000 mg | ORAL_TABLET | Freq: Every evening | ORAL | Status: DC
Start: 2016-01-24 — End: 2017-06-22

## 2016-01-24 NOTE — Assessment & Plan Note (Signed)
   Treatment plan as outlined above for allergic rhinitis.  A prescription has been provided for Pataday, one drop per eye daily as needed. 

## 2016-01-24 NOTE — Assessment & Plan Note (Signed)
Coughing/wheezing.  The patient's history suggests asthma, however spirometry results today do not meet ATS criteria for that diagnosis.  A prescription has been provided for albuterol HFA, 1-2 inhalations every 4-6 hours as needed.  Subjective and objective measures of pulmonary function will be followed and the treatment plan will be adjusted accordingly.

## 2016-01-24 NOTE — Progress Notes (Addendum)
Follow-up Note  RE: [name] MRN: [id number] DOB: 04/21/1986 Date of Office Visit: 01/24/2016  Primary care provider: Caffie DammeSMITH, KARLA, MD Referring provider: Caffie DammeSmith, Karla, MD  History of present illness: HPI Comments: [name] is a 30 y.o. female with allergic rhinitis and food allergy who presents today for sick visit.  She was last seen in this clinic in May 2016.  She reports that despite taking multiple allergy medications, over the past month she has suffered from intense sinus pressure/pain, stating that it feels like her head is "going to burst", thick postnasal drainage, irritated throat, coughing, itchy/watery/puffy eyes, nasal congestion, and sneezing.  She denies fevers and chills.  She experiences intensified nasal and ocular symptoms if she is even outdoors briefly during high pollen counts.  She was supposed to initiate aeroallergen immunotherapy but failed to do so for unclear reasons.  She also complains of coughing associated with occasional chest tightness and wheezing.  She acknowledges that smoking cigarettes "does not help" and plans to start cutting back in the near future.   Assessment and plan: Acute sinusitis  Prednisone has been provided, 60 mg x3 days, 40 mg x3 days, 20 mg x3 day, then stop.  Continue Nasonex, one spray per nostril twice daily.  Nasal saline lavage (NeilMed) as needed has been recommended along with instructions for proper administration.  Guaifenesin 1200 mg (plus/minus pseudoephedrine 120 mg) twice daily as needed with adequate hydration. Pseudoephedrine is only to be used for short-term relief of nasal/sinus congestion. Long-term use is discouraged due to potential side effects.   The patient has been asked to contact me if her symptoms persist, progress, or if she becomes febrile. Otherwise, she may return for follow up in 4 months.  Perennial and seasonal allergic rhinitis As  Rudi experiences frequent/severe  symptoms despite attempts at allergen avoidance and multiple medications she is an excellent candidate for aeroallergen immunotherapy.  The risks and benefits of aeroallergen immunotherapy have been discussed. The patient is motivated to initiate immunotherapy to reduce symptoms and decrease medication requirement. Informed consent has been signed and allergen vaccine orders have been submitted. Medications will be decreased or discontinued as symptom relief from immunotherapy becomes evident.  A prescription has been provided for levocetirizine, 5 mg daily as needed.  Continue Nasonex, one spray per nostril twice daily as needed.  Coughing/wheezing Coughing/wheezing.  The patient's history suggests asthma, however spirometry results today do not meet ATS criteria for that diagnosis.  A prescription has been provided for albuterol HFA, 1-2 inhalations every 4-6 hours as needed.  Subjective and objective measures of pulmonary function will be followed and the treatment plan will be adjusted accordingly.  Seasonal allergic conjunctivitis  Treatment plan as outlined above for allergic rhinitis.  A prescription has been provided for Pataday, one drop per eye daily as needed.    Meds ordered this encounter  Medications  . albuterol (PROAIR HFA) 108 (90 Base) MCG/ACT inhaler    Sig: Inhale 2 puffs into the lungs every 4 (four) hours as needed for wheezing or shortness of breath.    Dispense:  1 Inhaler    Refill:  2  . levocetirizine (XYZAL) 5 MG tablet    Sig: Take 1 tablet (5 mg total) by mouth every evening.    Dispense:  30 tablet    Refill:  5  . predniSONE (DELTASONE) tablet 10 mg    Sig:   . Olopatadine HCl (PATADAY) 0.2 % SOLN    Sig: Place 1  drop into both eyes 1 day or 1 dose.    Dispense:  1 Bottle    Refill:  5    Diagnositics: Spirometry:  Normal with an FEV1 of 109% predicted.  Please see scanned spirometry results for details.    Physical examination: Blood  pressure 110/70, pulse 72, resp. rate 16, unknown if currently breastfeeding.  General: Alert, interactive, in no acute distress. HEENT: TMs pearly gray, turbinates edematous with thick discharge, post-pharynx erythematous. Neck: Supple without lymphadenopathy. Lungs: Mildly decreased breath sounds bilaterally without wheezing, rhonchi or rales. CV: Normal S1, S2 without murmurs. Skin: Warm and dry, without lesions or rashes.  The following portions of the patient's history were reviewed and updated as appropriate: allergies, current medications, past family history, past medical history, past social history, past surgical history and problem list.    Medication List       This list is accurate as of: 01/24/16  1:49 PM.  Always use your most recent med list.               acetaminophen 500 MG tablet  Commonly known as:  TYLENOL  Take 500 mg by mouth every 6 (six) hours as needed for moderate pain.     albuterol 108 (90 Base) MCG/ACT inhaler  Commonly known as:  PROAIR HFA  Inhale 2 puffs into the lungs every 4 (four) hours as needed for wheezing or shortness of breath.     cetirizine 10 MG tablet  Commonly known as:  ZYRTEC  Take 10 mg by mouth daily.     diphenhydrAMINE 25 MG tablet  Commonly known as:  BENADRYL  Take 50 mg by mouth every 6 (six) hours as needed for allergies.     EPIPEN 2-PAK 0.3 mg/0.3 mL Soaj injection  Generic drug:  EPINEPHrine  Inject into the muscle once.     levocetirizine 5 MG tablet  Commonly known as:  XYZAL  Take 1 tablet (5 mg total) by mouth every evening.     metroNIDAZOLE 500 MG tablet  Commonly known as:  FLAGYL  Tak all 4 tabs at once     mometasone 50 MCG/ACT nasal spray  Commonly known as:  NASONEX  Place 2 sprays into the nose daily as needed (STARTED TWO WEEKS AGO.).     Olopatadine HCl 0.2 % Soln  Commonly known as:  PATADAY  Place 1 drop into both eyes 1 day or 1 dose.        Allergies  Allergen Reactions  .  Shellfish Allergy     POSITIVE ALLERGY TEST    I appreciate the opportunity to take part in this Gretchen's care. Please do not hesitate to contact me with questions.  Sincerely,   R. Jorene Guest, MD

## 2016-01-24 NOTE — Addendum Note (Signed)
Addended by: Candis SchatzBOBBITT, Rozelia Catapano C on: 01/24/2016 01:49 PM   Modules accepted: Orders

## 2016-01-24 NOTE — Assessment & Plan Note (Signed)
As  [name] experiences frequent/severe symptoms despite attempts at allergen avoidance and multiple medications she is an excellent candidate for aeroallergen immunotherapy.  The risks and benefits of aeroallergen immunotherapy have been discussed. The patient is motivated to initiate immunotherapy to reduce symptoms and decrease medication requirement. Informed consent has been signed and allergen vaccine orders have been submitted. Medications will be decreased or discontinued as symptom relief from immunotherapy becomes evident.  A prescription has been provided for levocetirizine, 5 mg daily as needed.  Continue Nasonex, one spray per nostril twice daily as needed.

## 2016-01-24 NOTE — Assessment & Plan Note (Signed)
   Prednisone has been provided, 60 mg x3 days, 40 mg x3 days, 20 mg x3 day, then stop.  Continue Nasonex, one spray per nostril twice daily.  Nasal saline lavage (NeilMed) as needed has been recommended along with instructions for proper administration.  Guaifenesin 1200 mg (plus/minus pseudoephedrine 120 mg) twice daily as needed with adequate hydration. Pseudoephedrine is only to be used for short-term relief of nasal/sinus congestion. Long-term use is discouraged due to potential side effects.   The patient has been asked to contact me if her symptoms persist, progress, or if she becomes febrile. Otherwise, she may return for follow up in 4 months.

## 2016-01-24 NOTE — Patient Instructions (Addendum)
Acute sinusitis  Prednisone has been provided, 60 mg x3 days, 40 mg x3 days, 20 mg x3 day, then stop.  Continue Nasonex, one spray per nostril twice daily.  Nasal saline lavage (NeilMed) as needed has been recommended along with instructions for proper administration.  Guaifenesin 1200 mg (plus/minus pseudoephedrine 120 mg) twice daily as needed with adequate hydration. Pseudoephedrine is only to be used for short-term relief of nasal/sinus congestion. Long-term use is discouraged due to potential side effects.   The patient has been asked to contact me if her symptoms persist, progress, or if she becomes febrile. Otherwise, she may return for follow up in 4 months.  Perennial and seasonal allergic rhinitis As  [name] experiences frequent/severe symptoms despite attempts at allergen avoidance and multiple medications she is an excellent candidate for aeroallergen immunotherapy.  The risks and benefits of aeroallergen immunotherapy have been discussed. The patient is motivated to initiate immunotherapy to reduce symptoms and decrease medication requirement. Informed consent has been signed and allergen vaccine orders have been submitted. Medications will be decreased or discontinued as symptom relief from immunotherapy becomes evident.  A prescription has been provided for levocetirizine, 5 mg daily as needed.  Continue Nasonex, one spray per nostril twice daily as needed.  Coughing/wheezing Coughing/wheezing.  The patient's history suggests asthma, however spirometry results today do not meet ATS criteria for that diagnosis.  A prescription has been provided for albuterol HFA, 1-2 inhalations every 4-6 hours as needed.  Subjective and objective measures of pulmonary function will be followed and the treatment plan will be adjusted accordingly.  Seasonal allergic conjunctivitis  Treatment plan as outlined above for allergic rhinitis.  A prescription has been provided for Pataday, one  drop per eye daily as needed.    Return in about 4 months (around 05/26/2016), or if symptoms worsen or fail to improve.

## 2016-01-26 DIAGNOSIS — J3089 Other allergic rhinitis: Secondary | ICD-10-CM | POA: Diagnosis not present

## 2016-01-27 DIAGNOSIS — J301 Allergic rhinitis due to pollen: Secondary | ICD-10-CM | POA: Diagnosis not present

## 2016-04-01 ENCOUNTER — Encounter (HOSPITAL_COMMUNITY): Payer: Self-pay | Admitting: *Deleted

## 2016-04-01 ENCOUNTER — Inpatient Hospital Stay (HOSPITAL_COMMUNITY)
Admission: AD | Admit: 2016-04-01 | Discharge: 2016-04-01 | Disposition: A | Discharge status: Home / Self Care | Payer: Medicaid Other | Source: Ambulatory Visit | Attending: Family Medicine | Admitting: Family Medicine

## 2016-04-01 DIAGNOSIS — B9689 Other specified bacterial agents as the cause of diseases classified elsewhere: Secondary | ICD-10-CM

## 2016-04-01 DIAGNOSIS — J329 Chronic sinusitis, unspecified: Secondary | ICD-10-CM | POA: Diagnosis present

## 2016-04-01 DIAGNOSIS — N898 Other specified noninflammatory disorders of vagina: Secondary | ICD-10-CM | POA: Diagnosis present

## 2016-04-01 DIAGNOSIS — F17210 Nicotine dependence, cigarettes, uncomplicated: Secondary | ICD-10-CM | POA: Insufficient documentation

## 2016-04-01 DIAGNOSIS — N760 Acute vaginitis: Secondary | ICD-10-CM

## 2016-04-01 DIAGNOSIS — A499 Bacterial infection, unspecified: Secondary | ICD-10-CM | POA: Diagnosis not present

## 2016-04-01 LAB — POCT PREGNANCY, URINE: Preg Test, Ur: NEGATIVE

## 2016-04-01 LAB — URINALYSIS, ROUTINE W REFLEX MICROSCOPIC
BILIRUBIN URINE: NEGATIVE
GLUCOSE, UA: NEGATIVE mg/dL
HGB URINE DIPSTICK: NEGATIVE
KETONES UR: NEGATIVE mg/dL
NITRITE: NEGATIVE
PH: 6.5 (ref 5.0–8.0)
Protein, ur: NEGATIVE mg/dL
SPECIFIC GRAVITY, URINE: 1.01 (ref 1.005–1.030)

## 2016-04-01 LAB — URINE MICROSCOPIC-ADD ON

## 2016-04-01 LAB — WET PREP, GENITAL
Sperm: NONE SEEN
TRICH WET PREP: NONE SEEN
Yeast Wet Prep HPF POC: NONE SEEN

## 2016-04-01 MED ORDER — METRONIDAZOLE 500 MG PO TABS: 500.0000 mg | ORAL_TABLET | Freq: Two times a day (BID) | ORAL | Status: DC

## 2016-04-01 NOTE — Discharge Instructions (Signed)

## 2016-04-01 NOTE — MAU Provider Note (Signed)
History   [id number] in with c/o sinus infection that started 3 says ago. Nasal secreations are yellowisg green. Denies fever. Also c/o mal odorous vag discharge and vag itching that started 2 days ago.  CSN: [id number]  Arrival date & time 04/01/16  1805   First Provider Initiated Contact with Patient 04/01/16 1825      Chief Complaint  Patient presents with  . Sinusitis  . Vaginal Discharge    HPI  Past Medical History  Diagnosis Date  . Seasonal allergies   . Infection     uti  . Chlamydia     age 30    Past Surgical History  Procedure Laterality Date  . Appendectomy    . Cesarean section      Family History  Problem Relation Age of Onset  . Hypertension Mother   . Hypertension Father   . Diabetes Father   . Hypertension Sister   . Asthma Sister   . Hypertension Brother   . Asthma Brother   . Asthma Daughter     Social History  Substance Use Topics  . Smoking status: Current Some Day Smoker -- 0.50 packs/day for 10 years    Types: Cigarettes  . Smokeless tobacco: Never Used  . Alcohol Use: Yes     Comment: rare    OB History    Gravida Para Term Preterm AB TAB SAB Ectopic Multiple Living   5 2 1 1 3 3    1       Review of Systems  Constitutional: Negative.   HENT: Positive for postnasal drip, rhinorrhea and sinus pressure.   Eyes: Negative.   Respiratory: Negative.   Cardiovascular: Negative.   Gastrointestinal: Negative.   Endocrine: Negative.   Genitourinary: Positive for vaginal discharge.  Musculoskeletal: Negative.   Skin: Negative.   Allergic/Immunologic: Negative.   Neurological: Negative.   Hematological: Negative.   Psychiatric/Behavioral: Negative.     Allergies  Shellfish allergy  Home Medications  No current outpatient prescriptions on file.  BP 110/72 mmHg  Pulse 93  Temp(Src) 98.2 F (36.8 C) (Oral)  Resp 18  LMP 03/10/2016  Physical Exam  Constitutional: She is oriented to person, place, and time. She appears  well-developed and well-nourished.  HENT:  Head: Normocephalic.  Eyes: Pupils are equal, round, and reactive to light.  Neck: Normal range of motion. Neck supple.  Cardiovascular: Normal rate, regular rhythm, normal heart sounds and intact distal pulses.   Pulmonary/Chest: Effort normal and breath sounds normal.  Abdominal: Soft. Bowel sounds are normal.  Genitourinary: Uterus normal. Vaginal discharge found.  Musculoskeletal: Normal range of motion.  Neurological: She is alert and oriented to person, place, and time. She has normal reflexes.  Skin: Skin is warm and dry.  Psychiatric: She has a normal mood and affect. Her behavior is normal. Judgment and thought content normal.    MAU Course  Procedures (including critical care time)  Labs Reviewed  WET PREP, GENITAL  RPR  URINALYSIS, ROUTINE W REFLEX MICROSCOPIC (NOT AT [facility])  GC/CHLAMYDIA PROBE AMP ([place]) NOT AT [facility]   No results found.   No diagnosis found.  BV    MDM  Wet prep pos clue, GC and chla done. Yellowish green vag discharge with amine odor noted with exam. Will tx and d/c home

## 2016-04-01 NOTE — MAU Note (Signed)
Pt states she thinks she has a sinus infection and a yeast infection.  Pt states she is having vaginal discharge.

## 2016-04-03 LAB — GC/CHLAMYDIA PROBE AMP (~~LOC~~) NOT AT ARMC
CHLAMYDIA, DNA PROBE: NEGATIVE
NEISSERIA GONORRHEA: NEGATIVE

## 2016-04-12 ENCOUNTER — Ambulatory Visit (INDEPENDENT_AMBULATORY_CARE_PROVIDER_SITE_OTHER): Payer: Medicaid Other | Admitting: Pediatrics

## 2016-04-12 ENCOUNTER — Encounter: Payer: Self-pay | Admitting: Pediatrics

## 2016-04-12 VITALS — BP 108/70 | HR 95 | Temp 98.4°F | Resp 20 | Ht 64.0 in | Wt 161.0 lb

## 2016-04-12 DIAGNOSIS — H1010 Acute atopic conjunctivitis, unspecified eye: Secondary | ICD-10-CM

## 2016-04-12 DIAGNOSIS — R062 Wheezing: Secondary | ICD-10-CM

## 2016-04-12 DIAGNOSIS — Z720 Tobacco use: Secondary | ICD-10-CM

## 2016-04-12 DIAGNOSIS — J329 Chronic sinusitis, unspecified: Secondary | ICD-10-CM

## 2016-04-12 DIAGNOSIS — H1045 Other chronic allergic conjunctivitis: Secondary | ICD-10-CM

## 2016-04-12 HISTORY — DX: Chronic sinusitis, unspecified: J32.9

## 2016-04-12 LAB — PULMONARY FUNCTION TEST

## 2016-04-12 MED ORDER — ALBUTEROL SULFATE HFA 108 (90 BASE) MCG/ACT IN AERS: 2.0000 | INHALATION_SPRAY | RESPIRATORY_TRACT | 2 refills | Status: DC | PRN

## 2016-04-12 MED ORDER — MOMETASONE FUROATE 50 MCG/ACT NA SUSP: 2.0000 | Freq: Every day | NASAL | 5 refills | Status: DC

## 2016-04-12 MED ORDER — EPINEPHRINE 0.3 MG/0.3ML IJ SOAJ: 0.3000 mg | Freq: Once | INTRAMUSCULAR | 2 refills | Status: AC

## 2016-04-12 MED ORDER — OLOPATADINE HCL 0.2 % OP SOLN: 1.0000 [drp] | Freq: Every day | OPHTHALMIC | 5 refills | Status: DC | PRN

## 2016-04-12 MED ORDER — MONTELUKAST SODIUM 10 MG PO TABS: 10.0000 mg | ORAL_TABLET | Freq: Every day | ORAL | 5 refills | Status: DC

## 2016-04-12 MED ORDER — CETIRIZINE HCL 10 MG PO TABS: 10.0000 mg | ORAL_TABLET | Freq: Every day | ORAL | 5 refills | Status: DC

## 2016-04-12 NOTE — Patient Instructions (Addendum)
Cetirizine 10 mg once a day for runny nose Pro-air 2 puffs every 4 hours if needed for wheezing or coughing spells Pataday 1 drop once a day if needed for itchy eyes Nasonex 2 sprays per nostril once a day for stuffy nose  Montelukast  10 mg once a day for coughing or wheezing Stop smoking cigarettes Complete Zithromax and the course of prednisone given yesterday at the walk-in clinic Start allergy injections in 2 weeks Ask your family doctor to refer you to an ENT specialist because of recurrent sinus infections and possible chronic sinus  infections See Dr. Nunzio Cobbs in follow-up in 2 weeks

## 2016-04-12 NOTE — Progress Notes (Signed)
[address] [place] [address] Dept: [phone]  FOLLOW UP NOTE  Patient ID: [name], female    DOB: 1985-09-23  Age: 30 y.o. MRN: [id number] Date of Office Visit: 04/12/2016  Assessment  Chief Complaint: Sinus Problem (for years has had problems with recurrent sinus infections)  HPI [name] presents for follow-up of allergic rhinitis, recurrent sinus infections, coughing and wheezing. She was seen in a walk-in clinic yesterday and was given an antibiotic and some prednisone because of a sinus infection. She reports that she has had sinus infections every month for the past year or so. She has not started allergy injections and would like to start on allergy injections. The antibiotic given was  Zithromax     Drug Allergies:  Allergies  Allergen Reactions  . Shellfish Allergy     POSITIVE ALLERGY TEST    Physical Exam: BP 108/70   Pulse 95   Temp 98.4 F (36.9 C) (Oral)   Resp 20   Ht  (1.626 m)   Wt 161 lb (73 kg)   LMP 03/10/2016   SpO2 96%   BMI 27.64 kg/m    Physical Exam  Constitutional: She is oriented to person, place, and time. She appears well-developed and well-nourished.  HENT:  Eyes normal. Ears normal. Nose mild swelling of nasal turbinates. Pharynx normal.  Neck: Neck supple.  Cardiovascular:  S1 and S2 normal no murmurs  Pulmonary/Chest:  Clear to percussion and auscultation  Lymphadenopathy:    She has no cervical adenopathy.  Neurological: She is alert and oriented to person, place, and time.  Skin:  Clear  Psychiatric: She has a normal mood and affect. Her behavior is normal. Judgment and thought content normal.  Vitals reviewed.   Diagnostics:  FVC 3.20 L FEV1 2.71 L. Predicted FVC 3.32 L predicted FEV1 2.95 L-the spirometry is in the normal range  Assessment and Plan: 1. Tobacco use   2. Seasonal allergic conjunctivitis   3. Wheezing   4. Recurrent sinus infections     Meds ordered this  encounter  Medications  . montelukast (SINGULAIR) 10 MG tablet    Sig: Take 1 tablet (10 mg total) by mouth at bedtime.    Dispense:  30 tablet    Refill:  5  . mometasone (NASONEX) 50 MCG/ACT nasal spray    Sig: Place 2 sprays into the nose daily.    Dispense:  17 g    Refill:  5  . Olopatadine HCl (PATADAY) 0.2 % SOLN    Sig: Place 1 drop into both eyes daily as needed (for itchy eyes).    Dispense:  1 Bottle    Refill:  5  . albuterol (PROAIR HFA) 108 (90 Base) MCG/ACT inhaler    Sig: Inhale 2 puffs into the lungs every 4 (four) hours as needed for wheezing or shortness of breath.    Dispense:  1 Inhaler    Refill:  2  . cetirizine (ZYRTEC) 10 MG tablet    Sig: Take 1 tablet (10 mg total) by mouth daily.    Dispense:  30 tablet    Refill:  5  . EPINEPHrine (EPIPEN 2-PAK) 0.3 mg/0.3 mL IJ SOAJ injection    Sig: Inject 0.3 mLs (0.3 mg total) into the muscle once.    Dispense:  2 Device    Refill:  2    Patient Instructions  Cetirizine 10 mg once a day for runny nose Pro-air 2 puffs every 4 hours if  needed for wheezing or coughing spells Pataday 1 drop once a day if needed for itchy eyes Nasonex 2 sprays per nostril once a day for stuffy nose  Montelukast  10 mg once a day for coughing or wheezing Stop smoking cigarettes Complete Zithromax and the course of prednisone given yesterday at the walk-in clinic Start allergy injections in 2 weeks Ask your family doctor to refer you to an ENT specialist because of recurrent sinus infections and possible chronic sinus  infections See Dr. Nunzio Cobbs in follow-up in 2 weeks   Return in about 2 weeks (around 04/26/2016).    Thank you for the opportunity to care for this patient.  Please do not hesitate to contact me with questions.  Tonette Bihari, M.D.  [facility] [address] [place], [place] [address] [phone]

## 2016-04-13 ENCOUNTER — Ambulatory Visit: Payer: Medicaid Other | Admitting: Pediatrics

## 2016-05-03 ENCOUNTER — Ambulatory Visit: Payer: Medicaid Other

## 2016-05-03 ENCOUNTER — Ambulatory Visit: Payer: Medicaid Other | Admitting: Allergy and Immunology

## 2016-10-19 ENCOUNTER — Ambulatory Visit: Payer: Medicaid Other | Admitting: Allergy and Immunology

## 2017-04-21 ENCOUNTER — Encounter (HOSPITAL_COMMUNITY): Payer: Self-pay | Admitting: Emergency Medicine

## 2017-04-21 ENCOUNTER — Emergency Department (HOSPITAL_COMMUNITY): Payer: Medicaid Other

## 2017-04-21 ENCOUNTER — Emergency Department (HOSPITAL_COMMUNITY)
Admission: EM | Admit: 2017-04-21 | Discharge: 2017-04-21 | Disposition: A | Discharge status: Home / Self Care | Payer: Medicaid Other | Attending: Emergency Medicine | Admitting: Emergency Medicine

## 2017-04-21 DIAGNOSIS — Y9389 Activity, other specified: Secondary | ICD-10-CM | POA: Diagnosis not present

## 2017-04-21 DIAGNOSIS — R0789 Other chest pain: Secondary | ICD-10-CM | POA: Diagnosis present

## 2017-04-21 DIAGNOSIS — Y92410 Unspecified street and highway as the place of occurrence of the external cause: Secondary | ICD-10-CM | POA: Diagnosis not present

## 2017-04-21 DIAGNOSIS — Z5321 Procedure and treatment not carried out due to patient leaving prior to being seen by health care provider: Secondary | ICD-10-CM | POA: Insufficient documentation

## 2017-04-21 DIAGNOSIS — Y999 Unspecified external cause status: Secondary | ICD-10-CM | POA: Diagnosis not present

## 2017-04-21 NOTE — ED Notes (Signed)
Pt called for room x2 with no answer 
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
## 2017-04-21 NOTE — ED Notes (Signed)
Pt called x1 for room, no answer  ?

## 2017-06-22 ENCOUNTER — Inpatient Hospital Stay (HOSPITAL_COMMUNITY): Payer: Medicaid Other

## 2017-06-22 ENCOUNTER — Encounter (HOSPITAL_COMMUNITY): Payer: Self-pay | Admitting: *Deleted

## 2017-06-22 ENCOUNTER — Inpatient Hospital Stay (HOSPITAL_COMMUNITY)
Admission: AD | Admit: 2017-06-22 | Discharge: 2017-06-22 | Disposition: A | Discharge status: Home / Self Care | Payer: Medicaid Other | Source: Ambulatory Visit | Attending: Obstetrics and Gynecology | Admitting: Obstetrics and Gynecology

## 2017-06-22 DIAGNOSIS — Z91013 Allergy to seafood: Secondary | ICD-10-CM | POA: Diagnosis not present

## 2017-06-22 DIAGNOSIS — Z825 Family history of asthma and other chronic lower respiratory diseases: Secondary | ICD-10-CM | POA: Insufficient documentation

## 2017-06-22 DIAGNOSIS — O034 Incomplete spontaneous abortion without complication: Secondary | ICD-10-CM | POA: Diagnosis not present

## 2017-06-22 DIAGNOSIS — Z87891 Personal history of nicotine dependence: Secondary | ICD-10-CM | POA: Diagnosis not present

## 2017-06-22 DIAGNOSIS — Z8249 Family history of ischemic heart disease and other diseases of the circulatory system: Secondary | ICD-10-CM | POA: Insufficient documentation

## 2017-06-22 DIAGNOSIS — Z833 Family history of diabetes mellitus: Secondary | ICD-10-CM | POA: Diagnosis not present

## 2017-06-22 DIAGNOSIS — O209 Hemorrhage in early pregnancy, unspecified: Secondary | ICD-10-CM

## 2017-06-22 DIAGNOSIS — N939 Abnormal uterine and vaginal bleeding, unspecified: Secondary | ICD-10-CM | POA: Diagnosis present

## 2017-06-22 LAB — URINALYSIS, ROUTINE W REFLEX MICROSCOPIC
BILIRUBIN URINE: NEGATIVE
Bacteria, UA: NONE SEEN
Glucose, UA: NEGATIVE mg/dL
KETONES UR: NEGATIVE mg/dL
LEUKOCYTES UA: NEGATIVE
NITRITE: NEGATIVE
PROTEIN: NEGATIVE mg/dL
SPECIFIC GRAVITY, URINE: 1.015 (ref 1.005–1.030)
pH: 6 (ref 5.0–8.0)

## 2017-06-22 LAB — WET PREP, GENITAL
SPERM: NONE SEEN
Trich, Wet Prep: NONE SEEN
YEAST WET PREP: NONE SEEN

## 2017-06-22 LAB — ABO/RH: ABO/RH(D): O POS

## 2017-06-22 LAB — CBC
HCT: 39.3 % (ref 36.0–46.0)
HEMOGLOBIN: 14 g/dL (ref 12.0–15.0)
MCH: 32 pg (ref 26.0–34.0)
MCHC: 35.6 g/dL (ref 30.0–36.0)
MCV: 89.7 fL (ref 78.0–100.0)
Platelets: 202 10*3/uL (ref 150–400)
RBC: 4.38 MIL/uL (ref 3.87–5.11)
RDW: 12.4 % (ref 11.5–15.5)
WBC: 5.6 10*3/uL (ref 4.0–10.5)

## 2017-06-22 LAB — POCT PREGNANCY, URINE: PREG TEST UR: POSITIVE — AB

## 2017-06-22 LAB — HCG, QUANTITATIVE, PREGNANCY: HCG, BETA CHAIN, QUANT, S: 471 m[IU]/mL — AB (ref <5)

## 2017-06-22 MED ORDER — MISOPROSTOL 200 MCG PO TABS: 600.0000 ug | ORAL_TABLET | Freq: Once | ORAL | 1 refills | Status: DC

## 2017-06-22 MED ORDER — PROMETHAZINE HCL 25 MG PO TABS: 25.0000 mg | ORAL_TABLET | Freq: Four times a day (QID) | ORAL | 0 refills | Status: DC | PRN

## 2017-06-22 MED ORDER — OXYCODONE-ACETAMINOPHEN 5-325 MG PO TABS: 1.0000 | ORAL_TABLET | Freq: Four times a day (QID) | ORAL | 0 refills | Status: DC | PRN

## 2017-06-22 NOTE — MAU Note (Addendum)
On the 8/25 had an abortion, was 6 wks preg.  Kept having preg symtpoms. Pee test still positive a month later, this past wk, was still positive.  Came on period 3 wks ago, still bleeding. When the bleeding started, was having bad cramps

## 2017-06-22 NOTE — MAU Provider Note (Signed)
History     CSN: [id number]  Arrival date and time: 06/22/17 1510   First Provider Initiated Contact with Patient 06/22/17 1628      Chief Complaint  Patient presents with  . Possible Pregnancy  . Vaginal Bleeding   HPI [name] is a 31 y.o. [id number] at [redacted]w[redacted]d by LMP who presents with vaginal bleeding & positive pregnancy test. Patient had abortion on 8/25 at [facility], she was 6 weeks at that time. States since then she has had daily bleeding & continues to have a pregnancy test. States she hasn't had intercourse since the procedure. Attempted to contact them but was told that eventually her pregnancy test would be negative. Reports daily spotting with intermittent heavier bleeding episodes. Has not saturated pads or passed clots. Denies fever/chills or abdominal pain.   Past Medical History:  Diagnosis Date  . Chlamydia    age 26  . Seasonal allergies     Past Surgical History:  Procedure Laterality Date  . APPENDECTOMY    . CESAREAN SECTION      Family History  Problem Relation Age of Onset  . Hypertension Mother   . Hypertension Father   . Diabetes Father   . Hypertension Sister   . Asthma Sister   . Hypertension Brother   . Asthma Brother   . Asthma Daughter     Social History  Substance Use Topics  . Smoking status: Former Smoker    Packs/day: 0.50    Years: 10.00    Types: Cigarettes  . Smokeless tobacco: Never Used  . Alcohol use No    Allergies:  Allergies  Allergen Reactions  . Shellfish Allergy     POSITIVE ALLERGY TEST    No prescriptions prior to admission.    Review of Systems  Constitutional: Negative for chills and fever.  Gastrointestinal: Negative for abdominal pain.  Genitourinary: Positive for vaginal bleeding and vaginal discharge.  Musculoskeletal: Negative for myalgias.   Physical Exam   Blood pressure 134/80, pulse 97, temperature 98.8 F (37.1 C), temperature source Oral, resp. rate 18, weight 170 lb 8  oz (77.3 kg), last menstrual period 03/22/2017, SpO2 100 %, unknown if currently breastfeeding.  Physical Exam  Nursing note and vitals reviewed. Constitutional: She is oriented to person, place, and time. She appears well-developed and well-nourished. No distress.  HENT:  Head: Normocephalic and atraumatic.  Eyes: Conjunctivae are normal. Right eye exhibits no discharge. Left eye exhibits no discharge. No scleral icterus.  Neck: Normal range of motion.  Cardiovascular: Normal rate, regular rhythm and normal heart sounds.   No murmur heard. Respiratory: Effort normal and breath sounds normal. No respiratory distress. She has no wheezes.  GI: Soft. Bowel sounds are normal. She exhibits no distension. There is no tenderness. There is no rebound and no guarding.  Genitourinary: Uterus is enlarged (8-9 weeks size). Uterus is not tender. Cervix exhibits no motion tenderness and no friability. There is bleeding (small amount of dark red blood; cervix closed) in the vagina.  Neurological: She is alert and oriented to person, place, and time.  Skin: Skin is warm and dry. She is not diaphoretic.  Psychiatric: She has a normal mood and affect. Her behavior is normal. Judgment and thought content normal.    MAU Course  Procedures Results for orders placed or performed during the hospital encounter of 06/22/17 (from the past 24 hour(s))  Urinalysis, Routine w reflex microscopic     Status: Abnormal   Collection Time: 06/22/17  3:42 PM  Result Value Ref Range   Color, Urine YELLOW YELLOW   APPearance CLEAR CLEAR   Specific Gravity, Urine 1.015 1.005 - 1.030   pH 6.0 5.0 - 8.0   Glucose, UA NEGATIVE NEGATIVE mg/dL   Hgb urine dipstick SMALL (A) NEGATIVE   Bilirubin Urine NEGATIVE NEGATIVE   Ketones, ur NEGATIVE NEGATIVE mg/dL   Protein, ur NEGATIVE NEGATIVE mg/dL   Nitrite NEGATIVE NEGATIVE   Leukocytes, UA NEGATIVE NEGATIVE   RBC / HPF 0-5 0 - 5 RBC/hpf   WBC, UA 0-5 0 - 5 WBC/hpf    Bacteria, UA NONE SEEN NONE SEEN   Squamous Epithelial / LPF 0-5 (A) NONE SEEN   Mucus PRESENT   CBC     Status: None   Collection Time: 06/22/17  3:46 PM  Result Value Ref Range   WBC 5.6 4.0 - 10.5 K/uL   RBC 4.38 3.87 - 5.11 MIL/uL   Hemoglobin 14.0 12.0 - 15.0 g/dL   HCT 16.1 09.6 - 04.5 %   MCV 89.7 78.0 - 100.0 fL   MCH 32.0 26.0 - 34.0 pg   MCHC 35.6 30.0 - 36.0 g/dL   RDW 40.9 81.1 - 91.4 %   Platelets 202 150 - 400 K/uL  hCG, quantitative, pregnancy     Status: Abnormal   Collection Time: 06/22/17  3:46 PM  Result Value Ref Range   hCG, Beta Chain, Quant, S 471 (H) <5 mIU/mL  ABO/Rh     Status: None (Preliminary result)   Collection Time: 06/22/17  3:46 PM  Result Value Ref Range   ABO/RH(D) O POS   Pregnancy, urine POC     Status: Abnormal   Collection Time: 06/22/17  3:52 PM  Result Value Ref Range   Preg Test, Ur POSITIVE (A) NEGATIVE  Wet prep, genital     Status: Abnormal   Collection Time: 06/22/17  4:40 PM  Result Value Ref Range   Yeast Wet Prep HPF POC NONE SEEN NONE SEEN   Trich, Wet Prep NONE SEEN NONE SEEN   Clue Cells Wet Prep HPF POC PRESENT (A) NONE SEEN   WBC, Wet Prep HPF POC MODERATE (A) NONE SEEN   Sperm NONE SEEN    US Ob Comp Less 14 Wks  Result Date: 06/22/2017 CLINICAL DATA:  31 year old female with vaginal bleeding in the first trimester pregnancy, positive home pregnancy test. EXAM: OBSTETRIC <14 WK [place] AND TRANSVAGINAL OB US TECHNIQUE: Both transabdominal and transvaginal ultrasound examinations were performed for complete evaluation of the gestation as well as the maternal uterus, adnexal regions, and pelvic cul-de-sac. Transvaginal technique was performed to assess early pregnancy. COMPARISON:  None relevant FINDINGS: Intrauterine gestational sac: Single, but somewhat large and irregular (image 47). Yolk sac:  No definite Embryo:  None Cardiac Activity: None MSD: 39  mm   9 w   1  d Subchorionic hemorrhage:  None Maternal uterus/adnexae: No  pelvic free fluid. Right ovary measures 2.1 x 3.2 x 1.5 cm and appears normal with several simple up the script goals PICC and 5 x 2.5 cm and appears normal with several small cysts or follicles. IMPRESSION: 1. Somewhat large and irregular intrauterine gestational sac with no embryo identified. Mean sac diameter of 39 mm. This meets definitive criteria for failed pregnancy (SRU consensus guidelines: Diagnostic Criteria for Nonviable Pregnancy Early in the First Trimester. Malva Limes Med [id number]). 2. No pelvic free fluid or acute maternal findings visualized. Electronically Signed   By: Rexene Edison  Margo Aye M.D.   On: 06/22/2017 17:40   US Ob Transvaginal  Result Date: 06/22/2017 CLINICAL DATA:  31 year old female with vaginal bleeding in the first trimester pregnancy, positive home pregnancy test. EXAM: OBSTETRIC <14 WK [place] AND TRANSVAGINAL OB US TECHNIQUE: Both transabdominal and transvaginal ultrasound examinations were performed for complete evaluation of the gestation as well as the maternal uterus, adnexal regions, and pelvic cul-de-sac. Transvaginal technique was performed to assess early pregnancy. COMPARISON:  None relevant FINDINGS: Intrauterine gestational sac: Single, but somewhat large and irregular (image 47). Yolk sac:  No definite Embryo:  None Cardiac Activity: None MSD: 39  mm   9 w   1  d Subchorionic hemorrhage:  None Maternal uterus/adnexae: No pelvic free fluid. Right ovary measures 2.1 x 3.2 x 1.5 cm and appears normal with several simple up the script goals PICC and 5 x 2.5 cm and appears normal with several small cysts or follicles. IMPRESSION: 1. Somewhat large and irregular intrauterine gestational sac with no embryo identified. Mean sac diameter of 39 mm. This meets definitive criteria for failed pregnancy (SRU consensus guidelines: Diagnostic Criteria for Nonviable Pregnancy Early in the First Trimester. Malva Limes Med [phone]). 2. No pelvic free fluid or acute maternal findings  visualized. Electronically Signed   By: Odessa Fleming M.D.   On: 06/22/2017 17:40    MDM UPT positive CBC, BHCG, abo/rh, & ultrasound ordered CBC -- no leukocytosis & hemoglobin WNL VSS, NAD O positive Ultrasound shows empty IUGS measuring 39 mm, definitive for failed pregnancy C/w Dr. Earlene Plater regarding results. Will offer patient cytotec & have her f/u in the clinic in 1 week.  Discussed results of ultrasound with patient. Agreeable to cytotec. Patient deemed reliable to keep f/u or return as needed.   Assessment and Plan  A: 1. Retained products of conception following abortion   2. Vaginal bleeding in pregnancy, first trimester    P: Discharge home Rx cytotec, percocet, & phenergan Msg to [facility] for f/u in 1 week Discussed reasons to return to [facility] including heavy bleeding, syncope, severe pain, and/or fever/chills  Judeth Horn 06/22/2017, 4:28 PM

## 2017-06-22 NOTE — Discharge Instructions (Signed)

## 2017-06-25 LAB — GC/CHLAMYDIA PROBE AMP (~~LOC~~) NOT AT ARMC
CHLAMYDIA, DNA PROBE: NEGATIVE
Neisseria Gonorrhea: NEGATIVE

## 2017-07-10 ENCOUNTER — Encounter: Payer: Medicaid Other | Admitting: Obstetrics and Gynecology

## 2017-07-11 ENCOUNTER — Ambulatory Visit (INDEPENDENT_AMBULATORY_CARE_PROVIDER_SITE_OTHER): Payer: Medicaid Other | Admitting: Family Medicine

## 2017-07-11 ENCOUNTER — Encounter: Payer: Self-pay | Admitting: Family Medicine

## 2017-07-11 VITALS — BP 121/79 | HR 106 | Ht 64.0 in | Wt 168.9 lb

## 2017-07-11 DIAGNOSIS — O021 Missed abortion: Secondary | ICD-10-CM

## 2017-07-11 NOTE — Progress Notes (Signed)
   Subjective:    Patient ID: [name], female    DOB: 03/28/1986, 31 y.o.   MRN: [id number]  HPI Patient follow up for failed pregnancy. Took cytotec on 06/22/17.  She had bleeding, and passed tissue.  She continued to have some mild bleeding for 3 days after that, and then stopped bleeding.  She currently has some pelvic discomfort.  No active bleeding or discharge.   Review of Systems     Objective:   Physical Exam  Constitutional: She appears well-developed and well-nourished.  Cardiovascular: Normal rate and regular rhythm.   Pulmonary/Chest: Effort normal and breath sounds normal.  Abdominal: Soft. Bowel sounds are normal. She exhibits no distension. There is no tenderness. There is no rebound and no guarding.  Skin: Skin is warm and dry.  Psychiatric: She has a normal mood and affect. Her behavior is normal. Judgment and thought content normal.      Assessment & Plan:  1. Missed abortion Recheck hCG.  Discussed contraception -patient declines for now.  Follow-up for annual exam - Beta HCG, Quant

## 2017-07-12 LAB — BETA HCG QUANT (REF LAB): hCG Quant: <1 m[IU]/mL

## 2017-08-15 ENCOUNTER — Ambulatory Visit: Payer: Medicaid Other | Admitting: Family Medicine

## 2017-09-06 ENCOUNTER — Ambulatory Visit: Payer: Medicaid Other | Admitting: Obstetrics and Gynecology

## 2017-09-07 NOTE — Progress Notes (Signed)
Pt.DNKA for annual exam, per discussion with provider do not need to reschedule, unlees pt. Calls.

## 2019-06-25 ENCOUNTER — Encounter (HOSPITAL_COMMUNITY): Payer: Self-pay | Admitting: *Deleted

## 2019-06-25 ENCOUNTER — Inpatient Hospital Stay (HOSPITAL_COMMUNITY)
Admission: AD | Admit: 2019-06-25 | Discharge: 2019-06-25 | Disposition: A | Discharge status: Home / Self Care | Payer: Medicaid Other | Attending: Family Medicine | Admitting: Family Medicine

## 2019-06-25 ENCOUNTER — Inpatient Hospital Stay (HOSPITAL_COMMUNITY): Payer: Medicaid Other

## 2019-06-25 ENCOUNTER — Other Ambulatory Visit: Payer: Self-pay

## 2019-06-25 DIAGNOSIS — O99611 Diseases of the digestive system complicating pregnancy, first trimester: Secondary | ICD-10-CM | POA: Insufficient documentation

## 2019-06-25 DIAGNOSIS — O26891 Other specified pregnancy related conditions, first trimester: Secondary | ICD-10-CM

## 2019-06-25 DIAGNOSIS — Z3490 Encounter for supervision of normal pregnancy, unspecified, unspecified trimester: Secondary | ICD-10-CM

## 2019-06-25 DIAGNOSIS — K5900 Constipation, unspecified: Secondary | ICD-10-CM | POA: Diagnosis not present

## 2019-06-25 DIAGNOSIS — A5901 Trichomonal vulvovaginitis: Secondary | ICD-10-CM | POA: Insufficient documentation

## 2019-06-25 DIAGNOSIS — Z3A08 8 weeks gestation of pregnancy: Secondary | ICD-10-CM | POA: Insufficient documentation

## 2019-06-25 DIAGNOSIS — O360910 Maternal care for other rhesus isoimmunization, first trimester, not applicable or unspecified: Secondary | ICD-10-CM

## 2019-06-25 DIAGNOSIS — R12 Heartburn: Secondary | ICD-10-CM | POA: Diagnosis not present

## 2019-06-25 DIAGNOSIS — Z87891 Personal history of nicotine dependence: Secondary | ICD-10-CM | POA: Diagnosis not present

## 2019-06-25 DIAGNOSIS — O98311 Other infections with a predominantly sexual mode of transmission complicating pregnancy, first trimester: Secondary | ICD-10-CM | POA: Diagnosis present

## 2019-06-25 DIAGNOSIS — O4690 Antepartum hemorrhage, unspecified, unspecified trimester: Secondary | ICD-10-CM

## 2019-06-25 DIAGNOSIS — Z8249 Family history of ischemic heart disease and other diseases of the circulatory system: Secondary | ICD-10-CM | POA: Insufficient documentation

## 2019-06-25 DIAGNOSIS — Z6790 Unspecified blood type, Rh positive: Secondary | ICD-10-CM

## 2019-06-25 DIAGNOSIS — R110 Nausea: Secondary | ICD-10-CM

## 2019-06-25 DIAGNOSIS — O209 Hemorrhage in early pregnancy, unspecified: Secondary | ICD-10-CM | POA: Insufficient documentation

## 2019-06-25 LAB — COMPREHENSIVE METABOLIC PANEL
ALT: 43 U/L (ref 0–44)
AST: 26 U/L (ref 15–41)
Albumin: 3.6 g/dL (ref 3.5–5.0)
Alkaline Phosphatase: 52 U/L (ref 38–126)
Anion gap: 8 (ref 5–15)
BUN: 10 mg/dL (ref 6–20)
CO2: 25 mmol/L (ref 22–32)
Calcium: 8.9 mg/dL (ref 8.9–10.3)
Chloride: 105 mmol/L (ref 98–111)
Creatinine, Ser: 0.82 mg/dL (ref 0.44–1.00)
GFR calc Af Amer: >60 mL/min (ref >60)
GFR calc non Af Amer: >60 mL/min (ref >60)
Glucose, Bld: 78 mg/dL (ref 70–99)
Potassium: 3.5 mmol/L (ref 3.5–5.1)
Sodium: 138 mmol/L (ref 135–145)
Total Bilirubin: 0.5 mg/dL (ref 0.3–1.2)
Total Protein: 6.3 g/dL — ABNORMAL LOW (ref 6.5–8.1)

## 2019-06-25 LAB — URINALYSIS, ROUTINE W REFLEX MICROSCOPIC
Bilirubin Urine: NEGATIVE
Glucose, UA: NEGATIVE mg/dL
Hgb urine dipstick: NEGATIVE
Ketones, ur: NEGATIVE mg/dL
Nitrite: NEGATIVE
Protein, ur: NEGATIVE mg/dL
Specific Gravity, Urine: 1.012 (ref 1.005–1.030)
pH: 7 (ref 5.0–8.0)

## 2019-06-25 LAB — WET PREP, GENITAL
Sperm: NONE SEEN
Yeast Wet Prep HPF POC: NONE SEEN

## 2019-06-25 LAB — CBC
HCT: 36.3 % (ref 36.0–46.0)
Hemoglobin: 13.3 g/dL (ref 12.0–15.0)
MCH: 33.3 pg (ref 26.0–34.0)
MCHC: 36.6 g/dL — ABNORMAL HIGH (ref 30.0–36.0)
MCV: 91 fL (ref 80.0–100.0)
Platelets: 176 10*3/uL (ref 150–400)
RBC: 3.99 MIL/uL (ref 3.87–5.11)
RDW: 11.9 % (ref 11.5–15.5)
WBC: 8.6 10*3/uL (ref 4.0–10.5)
nRBC: 0 % (ref 0.0–0.2)

## 2019-06-25 LAB — POCT PREGNANCY, URINE: Preg Test, Ur: POSITIVE — AB

## 2019-06-25 LAB — HCG, QUANTITATIVE, PREGNANCY: hCG, Beta Chain, Quant, S: 81772 m[IU]/mL — ABNORMAL HIGH (ref <5)

## 2019-06-25 MED ORDER — METRONIDAZOLE 500 MG PO TABS
2000.0000 mg | ORAL_TABLET | Freq: Once | ORAL | Status: AC
  Administered 2019-06-25: 2000 mg via ORAL
  Filled 2019-06-25: qty 4

## 2019-06-25 NOTE — Discharge Instructions (Signed)
Vaginal Bleeding During Pregnancy, First Trimester  A small amount of bleeding from the vagina (spotting) is relatively common during early pregnancy. It usually stops on its own. Various things may cause bleeding or spotting during early pregnancy. Some bleeding may be related to the pregnancy, and some may not. In many cases, the bleeding is normal and is not a problem. However, bleeding can also be a sign of something serious. Be sure to tell your health care provider about any vaginal bleeding right away. Some possible causes of vaginal bleeding during the first trimester include:  Infection or inflammation of the cervix.  Growths (polyps) on the cervix.  Miscarriage or threatened miscarriage.  Pregnancy tissue developing outside of the uterus (ectopic pregnancy).  A mass of tissue developing in the uterus due to an egg being fertilized incorrectly (molar pregnancy). Follow these instructions at home: Activity  Follow instructions from your health care provider about limiting your activity. Ask what activities are safe for you.  If needed, make plans for someone to help with your regular activities.  Do not have sex or orgasms until your health care provider says that this is safe. General instructions  Take over-the-counter and prescription medicines only as told by your health care provider.  Pay attention to any changes in your symptoms.  Do not use tampons or douche.  Write down how many pads you use each day, how often you change pads, and how soaked (saturated) they are.  If you pass any tissue from your vagina, save the tissue so you can show it to your health care provider.  Keep all follow-up visits as told by your health care provider. This is important. Contact a health care provider if:  You have vaginal bleeding during any part of your pregnancy.  You have cramps or labor pains.  You have a fever. Get help right away if:  You have severe cramps in your  back or abdomen.  You pass large clots or a large amount of tissue from your vagina.  Your bleeding increases.  You feel light-headed or weak, or you faint.  You have chills.  You are leaking fluid or have a gush of fluid from your vagina. Summary  A small amount of bleeding (spotting) from the vagina is relatively common during early pregnancy.  Various things may cause bleeding or spotting in early pregnancy.  Be sure to tell your health care provider about any vaginal bleeding right away. This information is not intended to replace advice given to you by your health care provider. Make sure you discuss any questions you have with your health care provider. Document Released: 06/07/2005 Document Revised: 12/17/2018 Document Reviewed: 11/30/2016 Elsevier Patient Education  2020 [place] of Pregnancy The first trimester of pregnancy is from week 1 until the end of week 13 (months 1 through 3). A week after a sperm fertilizes an egg, the egg will implant on the wall of the uterus. This embryo will begin to develop into a baby. Genes from you and your partner will form the baby. The female genes will determine whether the baby will be a boy or a girl. At 6-8 weeks, the eyes and face will be formed, and the heartbeat can be seen on ultrasound. At the end of 12 weeks, all the baby's organs will be formed. Now that you are pregnant, you will want to do everything you can to have a healthy baby. Two of the most important things are to get good  prenatal care and to follow your health care provider's instructions. Prenatal care is all the medical care you receive before the baby's birth. This care will help prevent, find, and treat any problems during the pregnancy and childbirth. Body changes during your first trimester Your body goes through many changes during pregnancy. The changes vary from woman to woman.  You may gain or lose a couple of pounds at first.  You may feel  sick to your stomach (nauseous) and you may throw up (vomit). If the vomiting is uncontrollable, call your health care provider.  You may tire easily.  You may develop headaches that can be relieved by medicines. All medicines should be approved by your health care provider.  You may urinate more often. Painful urination may mean you have a bladder infection.  You may develop heartburn as a result of your pregnancy.  You may develop constipation because certain hormones are causing the muscles that push stool through your intestines to slow down.  You may develop hemorrhoids or swollen veins (varicose veins).  Your breasts may begin to grow larger and become tender. Your nipples may stick out more, and the tissue that surrounds them (areola) may become darker.  Your gums may bleed and may be sensitive to brushing and flossing.  Dark spots or blotches (chloasma, mask of pregnancy) may develop on your face. This will likely fade after the baby is born.  Your menstrual periods will stop.  You may have a loss of appetite.  You may develop cravings for certain kinds of food.  You may have changes in your emotions from day to day, such as being excited to be pregnant or being concerned that something may go wrong with the pregnancy and baby.  You may have more vivid and strange dreams.  You may have changes in your hair. These can include thickening of your hair, rapid growth, and changes in texture. Some women also have hair loss during or after pregnancy, or hair that feels dry or thin. Your hair will most likely return to normal after your baby is born. What to expect at prenatal visits During a routine prenatal visit:  You will be weighed to make sure you and the baby are growing normally.  Your blood pressure will be taken.  Your abdomen will be measured to track your baby's growth.  The fetal heartbeat will be listened to between weeks 10 and 14 of your pregnancy.  Test  results from any previous visits will be discussed. Your health care provider may ask you:  How you are feeling.  If you are feeling the baby move.  If you have had any abnormal symptoms, such as leaking fluid, bleeding, severe headaches, or abdominal cramping.  If you are using any tobacco products, including cigarettes, chewing tobacco, and electronic cigarettes.  If you have any questions. Other tests that may be performed during your first trimester include:  Blood tests to find your blood type and to check for the presence of any previous infections. The tests will also be used to check for low iron levels (anemia) and protein on red blood cells (Rh antibodies). Depending on your risk factors, or if you previously had diabetes during pregnancy, you may have tests to check for high blood sugar that affects pregnant women (gestational diabetes).  Urine tests to check for infections, diabetes, or protein in the urine.  An ultrasound to confirm the proper growth and development of the baby.  Fetal screens for spinal cord  problems (spina bifida) and Down syndrome.  HIV (human immunodeficiency virus) testing. Routine prenatal testing includes screening for HIV, unless you choose not to have this test.  You may need other tests to make sure you and the baby are doing well. Follow these instructions at home: Medicines  Follow your health care provider's instructions regarding medicine use. Specific medicines may be either safe or unsafe to take during pregnancy.  Take a prenatal vitamin that contains at least 600 micrograms (mcg) of folic acid.  If you develop constipation, try taking a stool softener if your health care provider approves. Eating and drinking   Eat a balanced diet that includes fresh fruits and vegetables, whole grains, good sources of protein such as meat, eggs, or tofu, and low-fat dairy. Your health care provider will help you determine the amount of weight gain  that is right for you.  Avoid raw meat and uncooked cheese. These carry germs that can cause birth defects in the baby.  Eating four or five small meals rather than three large meals a day may help relieve nausea and vomiting. If you start to feel nauseous, eating a few soda crackers can be helpful. Drinking liquids between meals, instead of during meals, also seems to help ease nausea and vomiting.  Limit foods that are high in fat and processed sugars, such as fried and sweet foods.  To prevent constipation: ? Eat foods that are high in fiber, such as fresh fruits and vegetables, whole grains, and beans. ? Drink enough fluid to keep your urine clear or pale yellow. Activity  Exercise only as directed by your health care provider. Most women can continue their usual exercise routine during pregnancy. Try to exercise for 30 minutes at least 5 days a week. Exercising will help you: ? Control your weight. ? Stay in shape. ? Be prepared for labor and delivery.  Experiencing pain or cramping in the lower abdomen or lower back is a good sign that you should stop exercising. Check with your health care provider before continuing with normal exercises.  Try to avoid standing for long periods of time. Move your legs often if you must stand in one place for a long time.  Avoid heavy lifting.  Wear low-heeled shoes and practice good posture.  You may continue to have sex unless your health care provider tells you not to. Relieving pain and discomfort  Wear a good support bra to relieve breast tenderness.  Take warm sitz baths to soothe any pain or discomfort caused by hemorrhoids. Use hemorrhoid cream if your health care provider approves.  Rest with your legs elevated if you have leg cramps or low back pain.  If you develop varicose veins in your legs, wear support hose. Elevate your feet for 15 minutes, 3-4 times a day. Limit salt in your diet. Prenatal care  Schedule your prenatal  visits by the twelfth week of pregnancy. They are usually scheduled monthly at first, then more often in the last 2 months before delivery.  Write down your questions. Take them to your prenatal visits.  Keep all your prenatal visits as told by your health care provider. This is important. Safety  Wear your seat belt at all times when driving.  Make a list of emergency phone numbers, including numbers for family, friends, the hospital, and police and fire departments. General instructions  Ask your health care provider for a referral to a local prenatal education class. Begin classes no later than the beginning of  month 6 of your pregnancy.  Ask for help if you have counseling or nutritional needs during pregnancy. Your health care provider can offer advice or refer you to specialists for help with various needs.  Do not use hot tubs, steam rooms, or saunas.  Do not douche or use tampons or scented sanitary pads.  Do not cross your legs for long periods of time.  Avoid cat litter boxes and soil used by cats. These carry germs that can cause birth defects in the baby and possibly loss of the fetus by miscarriage or stillbirth.  Avoid all smoking, herbs, alcohol, and medicines not prescribed by your health care provider. Chemicals in these products affect the formation and growth of the baby.  Do not use any products that contain nicotine or tobacco, such as cigarettes and e-cigarettes. If you need help quitting, ask your health care provider. You may receive counseling support and other resources to help you quit.  Schedule a dentist appointment. At home, brush your teeth with a soft toothbrush and be gentle when you floss. Contact a health care provider if:  You have dizziness.  You have mild pelvic cramps, pelvic pressure, or nagging pain in the abdominal area.  You have persistent nausea, vomiting, or diarrhea.  You have a bad smelling vaginal discharge.  You have pain when  you urinate.  You notice increased swelling in your face, hands, legs, or ankles.  You are exposed to fifth disease or chickenpox.  You are exposed to [place] measles (rubella) and have never had it. Get help right away if:  You have a fever.  You are leaking fluid from your vagina.  You have spotting or bleeding from your vagina.  You have severe abdominal cramping or pain.  You have rapid weight gain or loss.  You vomit blood or material that looks like coffee grounds.  You develop a severe headache.  You have shortness of breath.  You have any kind of trauma, such as from a fall or a car accident. Summary  The first trimester of pregnancy is from week 1 until the end of week 13 (months 1 through 3).  Your body goes through many changes during pregnancy. The changes vary from woman to woman.  You will have routine prenatal visits. During those visits, your health care provider will examine you, discuss any test results you may have, and talk with you about how you are feeling. This information is not intended to replace advice given to you by your health care provider. Make sure you discuss any questions you have with your health care provider. Document Released: 08/22/2001 Document Revised: 08/10/2017 Document Reviewed: 08/09/2016 Elsevier Patient Education  2020 [employer] Trichomoniasis Trichomoniasis is an STI (sexually transmitted infection) that can affect both women and men. In women, the outer area of the female genitalia (vulva) and the vagina are affected. In men, mainly the penis is affected, but the prostate and other reproductive organs can also be involved.  This condition can be treated with medicine. It often has no symptoms (is asymptomatic), especially in men. If not treated, trichomoniasis can last for months or years. What are the causes? This condition is caused by a parasite called Trichomonas vaginalis. Trichomoniasis most often spreads from person  to person (is contagious) through sexual contact. What increases the risk? The following factors may make you more likely to develop this condition:  Having unprotected sex.  Having sex with a partner who has trichomoniasis.  Having multiple sexual  partners.  Having had previous trichomoniasis infections or other STIs. What are the signs or symptoms? In women, symptoms of trichomoniasis include:  Abnormal vaginal discharge that is clear, white, gray, or yellow-green and foamy and has an unusual "fishy" odor.  Itching and irritation of the vagina and vulva.  Burning or pain during urination or sex.  Redness and swelling of the genitals. In men, symptoms of trichomoniasis include:  Penile discharge that may be foamy or contain pus.  Pain in the penis. This may happen only when urinating.  Itching or irritation inside the penis.  Burning after urination or ejaculation. How is this diagnosed? In women, this condition may be found during a routine Pap test or physical exam. It may be found in men during a routine physical exam. Your health care provider may do tests to help diagnose this infection, such as:  Urine tests (men and women).  The following in women: ? Testing the pH of the vagina. ? A vaginal swab test that checks for the Trichomonas vaginalis parasite. ? Testing vaginal secretions. Your health care provider may test you for other STIs, including HIV (human immunodeficiency virus). How is this treated? This condition is treated with medicine taken by mouth (orally), such as metronidazole or tinidazole, to fight the infection. Your sexual partner(s) also need to be tested and treated.  If you are a woman and you plan to become pregnant or think you may be pregnant, tell your health care provider right away. Some medicines that are used to treat the infection should not be taken during pregnancy. Your health care provider may recommend over-the-counter medicines or  creams to help relieve itching or irritation. You may be tested for infection again 3 months after treatment. Follow these instructions at home:  Take and use over-the-counter and prescription medicines, including creams, only as told by your health care provider.  Take your antibiotic medicine as told by your health care provider. Do not stop taking the antibiotic even if you start to feel better.  Do not have sex until 7-10 days after you finish your medicine, or until your health care provider approves. Ask your health care provider when you may start to have sex again.  (Women) Do not douche or wear tampons while you have the infection.  Discuss your infection with your sexual partner(s). Make sure that your partner gets tested and treated, if necessary.  Keep all follow-up visits as told by your health care provider. This is important. How is this prevented?   Use condoms every time you have sex. Using condoms correctly and consistently can help protect against STIs.  Avoid having multiple sexual partners.  Talk with your sexual partner about any symptoms that either of you may have, as well as any history of STIs.  Get tested for STIs and STDs (sexually transmitted diseases) before you have sex. Ask your partner to do the same.  Do not have sexual contact if you have symptoms of trichomoniasis or another STI. Contact a health care provider if:  You still have symptoms after you finish your medicine.  You develop pain in your abdomen.  You have pain when you urinate.  You have bleeding after sex.  You develop a rash.  You feel nauseous or you vomit.  You plan to become pregnant or think you may be pregnant. Summary  Trichomoniasis is an STI (sexually transmitted infection) that can affect both women and men.  This condition often has no symptoms (is asymptomatic), especially  in men.  Without treatment, this condition can last for months or years.  You should not  have sex until 7-10 days after you finish your medicine, or until your health care provider approves. Ask your health care provider when you may start to have sex again.  Discuss your infection with your sexual partner(s). Make sure that your partner gets tested and treated, if necessary. This information is not intended to replace advice given to you by your health care provider. Make sure you discuss any questions you have with your health care provider. Document Released: 02/21/2001 Document Revised: 06/11/2018 Document Reviewed: 06/11/2018 Elsevier Patient Education  2020 [employer].                  Safe Medications in Pregnancy    Acne: Benzoyl Peroxide Salicylic Acid  Backache/Headache: Tylenol: 2 regular strength every 4 hours OR              2 Extra strength every 6 hours  Colds/Coughs/Allergies: Benadryl (alcohol free) 25 mg every 6 hours as needed Breath right strips Claritin Cepacol throat lozenges Chloraseptic throat spray Cold-Eeze- up to three times per day Cough drops, alcohol free Flonase (by prescription only) Guaifenesin Mucinex Robitussin DM (plain only, alcohol free) Saline nasal spray/drops Sudafed (pseudoephedrine) & Actifed ** use only after [redacted] weeks gestation and if you do not have high blood pressure Tylenol Vicks Vaporub Zinc lozenges Zyrtec   Constipation: Colace Ducolax suppositories Fleet enema Glycerin suppositories Metamucil Milk of magnesia Miralax Senokot Smooth move tea  Diarrhea: Kaopectate Imodium A-D  *NO pepto Bismol  Hemorrhoids: Anusol Anusol HC Preparation H Tucks  Indigestion: Tums Maalox Mylanta Zantac  Pepcid  Insomnia: Benadryl (alcohol free)  every 6 hours as needed Tylenol PM Unisom, no Gelcaps  Leg Cramps: Tums MagGel  Nausea/Vomiting:  Bonine Dramamine Emetrol Ginger extract Sea bands Meclizine  Nausea medication to take during pregnancy:  Unisom (doxylamine succinate 25 mg  tablets) Take one tablet daily at bedtime. If symptoms are not adequately controlled, the dose can be increased to a maximum recommended dose of two tablets daily (1/2 tablet in the morning, 1/2 tablet mid-afternoon and one at bedtime). Vitamin B6  tablets. Take one tablet twice a day (up to 200 mg per day).  Skin Rashes: Aveeno products Benadryl cream or  every 6 hours as needed Calamine Lotion 1% cortisone cream  Yeast infection: Gyne-lotrimin 7 Monistat 7   **If taking multiple medications, please check labels to avoid duplicating the same active ingredients **take medication as directed on the label ** Do not exceed 4000 mg of tylenol in 24 hours **Do not take medications that contain aspirin or ibuprofen   [facility]     Phone: [phone]  [facility] [place]  Phone: [phone]  [facility] [place]  Phone: [phone]  [facility] [place]                           Phone: [phone]  [facility]          Phone: [phone]  [facility]    Phone: [phone]   [facility] [place])    Phone: [phone]  [facility]    Phone: [phone]  [facility]    Phone: [phone]  [facility]  Phone: [phone]  [facility]  Phone: [phone]  [facility]               Phone: [phone]  [facility] [place]   Phone: [phone]  [facility]    Phone: [phone]

## 2019-06-25 NOTE — MAU Note (Signed)
Did a preg test, said it was positive, was about 2 wks ago, wants confirmation.  Has been nauseous.  And thinks she has a yeast infection, thick chunky d/c- is raw down there. Sinuses are draining. Denies pain, no vag bleeding.

## 2019-06-25 NOTE — MAU Provider Note (Signed)
History     CSN: [id number]  Arrival date and time: 06/25/19 1654   First Provider Initiated Contact with Patient 06/25/19 1954      Chief Complaint  Patient presents with  . Possible Pregnancy  . Vaginal Discharge  . Nausea   [name] is a 33 y.o. [phone] at [redacted]w[redacted]d who presents to MAU for a "yeast infection." Pt reports she is "raw down there, itchy, uncomfortable." Pt reports no abnormal discharge, but reports "brownish stains."  Pt reports constant nausea, but denies vomiting. Pt reports constipation, last BM was today, but it was "really stiff and hard."  Pt also reports heartburn.  Pt denies vomiting, abdominal pain, diarrhea, or urinary problems. Pt denies fever, chills, fatigue, sweating or changes in appetite. Pt denies SOB or chest pain. Pt denies dizziness, HA, light-headedness, weakness.  Problems this pregnancy include: pt has not yet been seen. Allergies? shellfish Current medications/supplements? Benadryl for allergies Prenatal care provider? Pt requests list of providers  Pt's sister present for entire visit.   OB History    Gravida  7   Para  2   Term  1   Preterm  1   AB  3   Living  1     SAB      TAB  3   Ectopic      Multiple      Live Births  1           Past Medical History:  Diagnosis Date  . Chlamydia    age 75  . Seasonal allergies     Past Surgical History:  Procedure Laterality Date  . APPENDECTOMY    . CESAREAN SECTION      Family History  Problem Relation Age of Onset  . Hypertension Mother   . Hypertension Father   . Diabetes Father   . Hypertension Sister   . Asthma Sister   . Hypertension Brother   . Asthma Brother   . Asthma Daughter     Social History   Tobacco Use  . Smoking status: Former Smoker    Packs/day: 0.50    Years: 10.00    Pack years: 5.00    Types: Cigarettes  . Smokeless tobacco: Never Used  Substance Use Topics  . Alcohol use: No  . Drug use: No     Allergies:  Allergies  Allergen Reactions  . Shellfish Allergy     POSITIVE ALLERGY TEST    Medications Prior to Admission  Medication Sig Dispense Refill Last Dose  . acetaminophen (TYLENOL) 500 MG tablet Take 500 mg by mouth every 6 (six) hours as needed for moderate pain.   Past Week at Unknown time  . diphenhydrAMINE (BENADRYL) 25 MG tablet Take 25 mg by mouth.   06/25/2019 at Unknown time  . ibuprofen (ADVIL,MOTRIN) 800 MG tablet Take 800 mg by mouth every 8 (eight) hours as needed for moderate pain.     . misoprostol (CYTOTEC) 200 MCG tablet Take 3 tablets (600 mcg total) by mouth once. 3 tablet 1   . oxyCODONE-acetaminophen (PERCOCET/ROXICET) 5-325 MG tablet Take 1-2 tablets by mouth every 6 (six) hours as needed. (Patient not taking: Reported on 07/11/2017) 10 tablet 0   . promethazine (PHENERGAN) 25 MG tablet Take 1 tablet (25 mg total) by mouth every 6 (six) hours as needed for nausea or vomiting. (Patient not taking: Reported on 07/11/2017) 30 tablet 0     Review of Systems  Constitutional: Negative for chills, diaphoresis,  fatigue and fever.  Eyes: Negative for visual disturbance.  Respiratory: Negative for shortness of breath.   Cardiovascular: Negative for chest pain.  Gastrointestinal: Positive for constipation and nausea. Negative for abdominal pain, diarrhea and vomiting.  Genitourinary: Positive for vaginal bleeding and vaginal discharge. Negative for dysuria, flank pain, frequency, pelvic pain and urgency.  Neurological: Negative for dizziness, weakness, light-headedness and headaches.   Physical Exam   Blood pressure 119/73, pulse 93, temperature 98.6 F (37 C), temperature source Oral, resp. rate 16, height 5\' 4"  (1.626 m), weight 77.8 kg, last menstrual period 05/06/2019, SpO2 100 %, unknown if currently breastfeeding.  Patient Vitals for the past 24 hrs:  BP Temp Temp src Pulse Resp SpO2 Height Weight  06/25/19 1722 119/73 98.6 F (37 C) Oral 93 16 100 %  5\' 4"  (1.626 m) 77.8 kg   Physical Exam  Constitutional: She is oriented to person, place, and time. She appears well-developed and well-nourished. No distress.  HENT:  Head: Normocephalic and atraumatic.  Respiratory: Effort normal.  GI: Soft. She exhibits no distension and no mass. There is no abdominal tenderness. There is no rebound and no guarding.  Neurological: She is alert and oriented to person, place, and time.  Skin: Skin is warm and dry. She is not diaphoretic.  Psychiatric: She has a normal mood and affect. Her behavior is normal. Judgment and thought content normal.   Results for orders placed or performed during the hospital encounter of 06/25/19 (from the past 24 hour(s))  Pregnancy, urine POC     Status: Abnormal   Collection Time: 06/25/19  5:34 PM  Result Value Ref Range   Preg Test, Ur POSITIVE (A) NEGATIVE  Urinalysis, Routine w reflex microscopic     Status: Abnormal   Collection Time: 06/25/19  5:35 PM  Result Value Ref Range   Color, Urine YELLOW YELLOW   APPearance HAZY (A) CLEAR   Specific Gravity, Urine 1.012 1.005 - 1.030   pH 7.0 5.0 - 8.0   Glucose, UA NEGATIVE NEGATIVE mg/dL   Hgb urine dipstick NEGATIVE NEGATIVE   Bilirubin Urine NEGATIVE NEGATIVE   Ketones, ur NEGATIVE NEGATIVE mg/dL   Protein, ur NEGATIVE NEGATIVE mg/dL   Nitrite NEGATIVE NEGATIVE   Leukocytes,Ua MODERATE (A) NEGATIVE   RBC / HPF 0-5 0 - 5 RBC/hpf   WBC, UA 11-20 0 - 5 WBC/hpf   Bacteria, UA MANY (A) NONE SEEN   Squamous Epithelial / LPF 11-20 0 - 5   Mucus PRESENT    Trichomonas, UA PRESENT (A) NONE SEEN  Wet prep, genital     Status: Abnormal   Collection Time: 06/25/19  8:05 PM  Result Value Ref Range   Yeast Wet Prep HPF POC NONE SEEN NONE SEEN   Trich, Wet Prep PRESENT (A) NONE SEEN   Clue Cells Wet Prep HPF POC PRESENT (A) NONE SEEN   WBC, Wet Prep HPF POC MODERATE (A) NONE SEEN   Sperm NONE SEEN   CBC     Status: Abnormal   Collection Time: 06/25/19  8:17 PM   Result Value Ref Range   WBC 8.6 4.0 - 10.5 K/uL   RBC 3.99 3.87 - 5.11 MIL/uL   Hemoglobin 13.3 12.0 - 15.0 g/dL   HCT 06/27/19 06/27/19 - 71.6 %   MCV 91.0 80.0 - 100.0 fL   MCH 33.3 26.0 - 34.0 pg   MCHC 36.6 (H) 30.0 - 36.0 g/dL   RDW 96.7 89.3 - 81.0 %   Platelets 176  150 - 400 K/uL   nRBC 0.0 0.0 - 0.2 %  Comprehensive metabolic panel     Status: Abnormal   Collection Time: 06/25/19  8:17 PM  Result Value Ref Range   Sodium 138 135 - 145 mmol/L   Potassium 3.5 3.5 - 5.1 mmol/L   Chloride 105 98 - 111 mmol/L   CO2 25 22 - 32 mmol/L   Glucose, Bld 78 70 - 99 mg/dL   BUN 10 6 - 20 mg/dL   Creatinine, Ser 1.610.82 0.44 - 1.00 mg/dL   Calcium 8.9 8.9 - 09.610.3 mg/dL   Total Protein 6.3 (L) 6.5 - 8.1 g/dL   Albumin 3.6 3.5 - 5.0 g/dL   AST 26 15 - 41 U/L   ALT 43 0 - 44 U/L   Alkaline Phosphatase 52 38 - 126 U/L   Total Bilirubin 0.5 0.3 - 1.2 mg/dL   GFR calc non Af Amer >60 >60 mL/min   GFR calc Af Amer >60 >60 mL/min   Anion gap 8 5 - 15  hCG, quantitative, pregnancy     Status: Abnormal   Collection Time: 06/25/19  8:17 PM  Result Value Ref Range   hCG, Beta Chain, Quant, S 81,772 (H) <5 mIU/mL   [place] Ob Less Than 14 Weeks With Ob Transvaginal  Result Date: 06/25/2019 CLINICAL DATA:  33 year old pregnant female presents with vaginal bleeding. Quantitative beta HCG [id number]. EDC by LMP: 02/10/2020, projecting to an expected gestational age of [redacted] weeks 1 day. EXAM: OBSTETRIC <14 WK US AND TRANSVAGINAL OB US TECHNIQUE: Both transabdominal and transvaginal ultrasound examinations were performed for complete evaluation of the gestation as well as the maternal uterus, adnexal regions, and pelvic cul-de-sac. Transvaginal technique was performed to assess early pregnancy. COMPARISON:  No prior scans from this gestation. FINDINGS: Intrauterine gestational sac: Single Yolk sac:  Visualized. Embryo:  Visualized. Cardiac Activity: Visualized. Heart Rate: 157 bpm CRL:  17.6 mm   8 w   1 d                   US EDC: 02/03/2020 Subchorionic hemorrhage:  None visualized. Maternal uterus/adnexae: Anteverted uterus with no fibroids demonstrated. Trace simple free fluid in the pelvic cul-de-sac. Right ovary measures 2.8 x 1.6 x 1.6 cm. Left ovary measures 3.8 x 2.7 x 2.8 cm and contains a corpus luteum. No abnormal ovarian or adnexal masses. IMPRESSION: 1. Single living intrauterine gestation at 8 weeks 1 day by crown-rump length, without significant discrepancy with the provided menstrual dating. 2. No acute first-trimester gestational abnormality. Electronically Signed   By: Delbert PhenixJason A Poff M.D.   On: 06/25/2019 21:47    MAU Course  Procedures  MDM -multiple normal pregnancy-related complaints with minimal vaginal bleeding -r/o ectopic based on vaginal bleeding, though suspect VB is related to trichomonas infection, as per UA -UA: hazy/mod leuks/many bacteria/+trich, sending urine for culture -metronidazole 2000mg  given in MAU for trich -CBC: WNL -CMP: no abnormalities requiring treatment -US: single IUP, 9164w1d, FHR 150s -hCG: [id number] -ABO: O Positive -WetPrep: +trich, +ClueCells, +WBCs -GC/CT collected -pt discharged to home in stable condition  Orders Placed This Encounter  Procedures  . Culture, OB Urine    Standing Status:   Standing    Number of Occurrences:   1  . Wet prep, genital    Standing Status:   Standing    Number of Occurrences:   1  . US OB LESS THAN 14 WEEKS WITH OB TRANSVAGINAL    Standing Status:  Standing    Number of Occurrences:   1    Order Specific Question:   Symptom/Reason for Exam    Answer:   Vaginal bleeding in pregnancy [269485]  . Urinalysis, Routine w reflex microscopic    Standing Status:   Standing    Number of Occurrences:   1  . CBC    Standing Status:   Standing    Number of Occurrences:   1  . Comprehensive metabolic panel    Standing Status:   Standing    Number of Occurrences:   1  . hCG, quantitative, pregnancy    Standing Status:   Standing     Number of Occurrences:   1  . Pregnancy, urine POC    Standing Status:   Standing    Number of Occurrences:   1  . Discharge patient    Order Specific Question:   Discharge disposition    Answer:   01-Home or Self Care [1]    Order Specific Question:   Discharge patient date    Answer:   06/25/2019   Meds ordered this encounter  Medications  . metroNIDAZOLE (FLAGYL) tablet 2,000 mg   Assessment and Plan   1. Trichomonas vaginalis (TV) infection   2. Vaginal bleeding in pregnancy   3. Blood type, Rh positive   4. Nausea   5. Constipation during pregnancy in first trimester   6. Heartburn in pregnancy in first trimester   7. Intrauterine pregnancy   8. [redacted] weeks gestation of pregnancy    Allergies as of 06/25/2019      Reactions   Shellfish Allergy    POSITIVE ALLERGY TEST      Medication List    STOP taking these medications   ibuprofen 800 MG tablet Commonly known as: ADVIL   misoprostol 200 MCG tablet Commonly known as: Cytotec   promethazine 25 MG tablet Commonly known as: PHENERGAN     TAKE these medications   acetaminophen 500 MG tablet Commonly known as: TYLENOL Take 500 mg by mouth every 6 (six) hours as needed for moderate pain.   diphenhydrAMINE 25 MG tablet Commonly known as: BENADRYL Take 25 mg by mouth.   oxyCODONE-acetaminophen 5-325 MG tablet Commonly known as: PERCOCET/ROXICET Take 1-2 tablets by mouth every 6 (six) hours as needed.      -will call with culture results, if positive -list of safe meds in pregnancy given, discussed medications on list for nausea, constipation and heartburn, also discussed non-pharmacologic relief measures for these common pregnancy concerns -expedited partner therapy given for trichomonas, pt advised to avoid intercourse from now until 7days after partner takes therapy and to have TOC in 70months -list of safe meds in pregnancy given -list of area OBs given, pt advised to start [facility] -start PNVs -pt  discharged to home in stable condition  Elmyra Ricks E Zaki Gertsch 06/25/2019, 10:01 PM

## 2019-06-27 LAB — CULTURE, OB URINE: Culture: 90000 — AB

## 2019-06-28 ENCOUNTER — Telehealth: Payer: Self-pay | Admitting: Student

## 2019-06-28 NOTE — Telephone Encounter (Signed)
Needs to be treated for UTI. No answer & no option to leave a voicemail. Will try again tomorrow.  Jorje Guild, NP

## 2019-06-29 ENCOUNTER — Telehealth: Payer: Self-pay | Admitting: Advanced Practice Midwife

## 2019-06-29 NOTE — Telephone Encounter (Signed)
Second attempt to discuss urine culture results. No answer, no voicemail.  Mallie Snooks, MSN, CNM Certified Nurse Midwife, [facility] for [employer], [facility] 06/29/19 9:59 AM

## 2019-06-30 LAB — GC/CHLAMYDIA PROBE AMP (~~LOC~~) NOT AT ARMC
Chlamydia: NEGATIVE
Comment: NEGATIVE
Comment: NORMAL
Neisseria Gonorrhea: NEGATIVE

## 2019-08-07 ENCOUNTER — Other Ambulatory Visit: Payer: Self-pay

## 2019-08-07 ENCOUNTER — Inpatient Hospital Stay (HOSPITAL_COMMUNITY)
Admission: AD | Admit: 2019-08-07 | Discharge: 2019-08-07 | Disposition: A | Discharge status: Home / Self Care | Payer: Medicaid Other | Attending: Obstetrics and Gynecology | Admitting: Obstetrics and Gynecology

## 2019-08-07 ENCOUNTER — Encounter (HOSPITAL_COMMUNITY): Payer: Self-pay | Admitting: *Deleted

## 2019-08-07 DIAGNOSIS — O2342 Unspecified infection of urinary tract in pregnancy, second trimester: Secondary | ICD-10-CM | POA: Diagnosis not present

## 2019-08-07 DIAGNOSIS — O99891 Other specified diseases and conditions complicating pregnancy: Secondary | ICD-10-CM | POA: Insufficient documentation

## 2019-08-07 DIAGNOSIS — Z87891 Personal history of nicotine dependence: Secondary | ICD-10-CM | POA: Diagnosis not present

## 2019-08-07 DIAGNOSIS — Z3A14 14 weeks gestation of pregnancy: Secondary | ICD-10-CM

## 2019-08-07 DIAGNOSIS — R309 Painful micturition, unspecified: Secondary | ICD-10-CM | POA: Insufficient documentation

## 2019-08-07 DIAGNOSIS — N390 Urinary tract infection, site not specified: Secondary | ICD-10-CM | POA: Diagnosis not present

## 2019-08-07 LAB — URINALYSIS, ROUTINE W REFLEX MICROSCOPIC
Bacteria, UA: NONE SEEN
Bilirubin Urine: NEGATIVE
Glucose, UA: NEGATIVE mg/dL
Hgb urine dipstick: NEGATIVE
Ketones, ur: NEGATIVE mg/dL
Nitrite: NEGATIVE
Protein, ur: NEGATIVE mg/dL
Specific Gravity, Urine: 1.009 (ref 1.005–1.030)
WBC, UA: >50 WBC/hpf — ABNORMAL HIGH (ref 0–5)
pH: 9 — ABNORMAL HIGH (ref 5.0–8.0)

## 2019-08-07 MED ORDER — NITROFURANTOIN MONOHYD MACRO 100 MG PO CAPS: 100.0000 mg | ORAL_CAPSULE | Freq: Two times a day (BID) | ORAL | 0 refills | Status: AC

## 2019-08-07 MED ORDER — FLUTICASONE PROPIONATE 50 MCG/ACT NA SUSP: 1.0000 | Freq: Every day | NASAL | 2 refills | Status: DC

## 2019-08-07 NOTE — MAU Note (Signed)
Pt reports she has had painful urination for about 2 days. noticed a little bit of blood when wiping after urination.

## 2019-08-07 NOTE — Discharge Instructions (Signed)
Drink at least 8 8-oz glasses of water every day. Return if you develop fever, vomiting or back pain. Keep your appointment to start prenatal care. Use Covid precautions always.

## 2019-08-10 NOTE — MAU Provider Note (Signed)
History     CSN: [id number]  Arrival date and time: 08/07/19 1640   First Provider Initiated Contact with Patient 08/07/19 1725      Chief Complaint  Patient presents with  . Dysuria   HPI [name] 33 y.o. [redacted]w[redacted]d Client has signed up for prenatal care.  Reviewed previous labs and notes.  She was here in MAU in October and labs showed she had a UTI.  She never got a call and she has not been treated for UTI.  Is having worse dysuria and came thinking she has a UTI.  Denies fever and back pain.  No vomiting.  OB History    Gravida  7   Para  2   Term  1   Preterm  1   AB  4   Living  1     SAB      TAB  3   Ectopic      Multiple      Live Births  1           Past Medical History:  Diagnosis Date  . Chlamydia    age 35  . Seasonal allergies     Past Surgical History:  Procedure Laterality Date  . APPENDECTOMY    . CESAREAN SECTION      Family History  Problem Relation Age of Onset  . Hypertension Mother   . Hypertension Father   . Diabetes Father   . Hypertension Sister   . Asthma Sister   . Hypertension Brother   . Asthma Brother   . Asthma Daughter     Social History   Tobacco Use  . Smoking status: Former Smoker    Packs/day: 0.50    Years: 10.00    Pack years: 5.00    Types: Cigarettes  . Smokeless tobacco: Never Used  Substance Use Topics  . Alcohol use: No  . Drug use: No    Allergies:  Allergies  Allergen Reactions  . Shellfish Allergy     POSITIVE ALLERGY TEST    No medications prior to admission.    Review of Systems  Constitutional: Negative for fever.  Respiratory: Negative for cough and shortness of breath.   Gastrointestinal: Negative for abdominal pain, nausea and vomiting.  Genitourinary: Positive for dysuria and frequency. Negative for hematuria, vaginal bleeding and vaginal discharge.  Musculoskeletal: Negative for back pain.   Physical Exam   Blood pressure 111/68, pulse (!) 106,  temperature 98.3 F (36.8 C), resp. rate 18, height 5\' 4"  (1.626 m), weight 79.8 kg, last menstrual period 05/06/2019, unknown if currently breastfeeding.  Physical Exam  Nursing note and vitals reviewed. Constitutional: She is oriented to person, place, and time. She appears well-developed and well-nourished.  HENT:  Head: Normocephalic.  Eyes: EOM are normal.  Neck: Neck supple.  GI: Soft. There is no abdominal tenderness. There is no rebound and no guarding.  Musculoskeletal: Normal range of motion.     Comments: No CVA tenderness  Neurological: She is alert and oriented to person, place, and time.  Skin: Skin is warm and dry.  Psychiatric: She has a normal mood and affect.    MAU Course  Procedures Labs See lab results from 08-07-19  MDM Discussed lab results from last visit and prescribed medication for her to pick up and begin taking.  No evidence of pyelo.  Reviewed warning signs that would mean she needs to return.  Assessment and Plan  UTI in pregnancy - diagnosed  in October and yet untreated.  Plan Keep your appointment to begin prenatal care. Drink at least 8 8-oz glasses of water every day. Get your medication from your pharmacy and take as directed.  Macrobid prescribed. Return if you have back pain, vomiting or fever.   L  08/10/2019, 1:13 PM  Client seen earlier in the week on 08-06-19.  Note done on 08-10-19

## 2019-08-26 ENCOUNTER — Ambulatory Visit (INDEPENDENT_AMBULATORY_CARE_PROVIDER_SITE_OTHER): Payer: Medicaid Other | Admitting: Certified Nurse Midwife

## 2019-08-26 ENCOUNTER — Other Ambulatory Visit (HOSPITAL_COMMUNITY)
Admission: RE | Admit: 2019-08-26 | Discharge: 2019-08-26 | Disposition: A | Discharge status: Home / Self Care | Payer: Medicaid Other | Source: Ambulatory Visit | Attending: Certified Nurse Midwife | Admitting: Certified Nurse Midwife

## 2019-08-26 ENCOUNTER — Encounter: Payer: Self-pay | Admitting: Certified Nurse Midwife

## 2019-08-26 ENCOUNTER — Other Ambulatory Visit: Payer: Self-pay

## 2019-08-26 VITALS — BP 102/67 | HR 120 | Temp 99.0°F | Wt 170.0 lb

## 2019-08-26 DIAGNOSIS — B373 Candidiasis of vulva and vagina: Secondary | ICD-10-CM

## 2019-08-26 DIAGNOSIS — O23591 Infection of other part of genital tract in pregnancy, first trimester: Secondary | ICD-10-CM | POA: Insufficient documentation

## 2019-08-26 DIAGNOSIS — Z3480 Encounter for supervision of other normal pregnancy, unspecified trimester: Secondary | ICD-10-CM | POA: Insufficient documentation

## 2019-08-26 DIAGNOSIS — A5901 Trichomonal vulvovaginitis: Secondary | ICD-10-CM

## 2019-08-26 DIAGNOSIS — Z3481 Encounter for supervision of other normal pregnancy, first trimester: Secondary | ICD-10-CM

## 2019-08-26 DIAGNOSIS — O0990 Supervision of high risk pregnancy, unspecified, unspecified trimester: Secondary | ICD-10-CM | POA: Insufficient documentation

## 2019-08-26 DIAGNOSIS — O23592 Infection of other part of genital tract in pregnancy, second trimester: Secondary | ICD-10-CM

## 2019-08-26 DIAGNOSIS — O09892 Supervision of other high risk pregnancies, second trimester: Secondary | ICD-10-CM

## 2019-08-26 DIAGNOSIS — O34219 Maternal care for unspecified type scar from previous cesarean delivery: Secondary | ICD-10-CM

## 2019-08-26 DIAGNOSIS — J329 Chronic sinusitis, unspecified: Secondary | ICD-10-CM

## 2019-08-26 DIAGNOSIS — Z8751 Personal history of pre-term labor: Secondary | ICD-10-CM

## 2019-08-26 DIAGNOSIS — Z3A17 17 weeks gestation of pregnancy: Secondary | ICD-10-CM

## 2019-08-26 DIAGNOSIS — O2342 Unspecified infection of urinary tract in pregnancy, second trimester: Secondary | ICD-10-CM

## 2019-08-26 DIAGNOSIS — B3731 Acute candidiasis of vulva and vagina: Secondary | ICD-10-CM

## 2019-08-26 HISTORY — DX: Infection of other part of genital tract in pregnancy, first trimester: A59.01

## 2019-08-26 HISTORY — DX: Infection of other part of genital tract in pregnancy, first trimester: O23.591

## 2019-08-26 MED ORDER — HYDROXYPROGESTERONE CAPROATE 275 MG/1.1ML ~~LOC~~ SOAJ
275.0000 mg | Freq: Once | SUBCUTANEOUS | Status: AC
  Administered 2019-08-26: 09:00:00 275 mg via SUBCUTANEOUS

## 2019-08-26 MED ORDER — COMFORT FIT MATERNITY SUPP LG MISC: 1.0000 [IU] | Freq: Every day | 0 refills | Status: DC

## 2019-08-26 MED ORDER — TERCONAZOLE 0.4 % VA CREA: 1.0000 | TOPICAL_CREAM | Freq: Every day | VAGINAL | 0 refills | Status: DC

## 2019-08-26 MED ORDER — BLOOD PRESSURE KIT DEVI: 1.0000 | 0 refills | Status: DC | PRN

## 2019-08-26 NOTE — Patient Instructions (Signed)
Safe Medications in Pregnancy   Acne: Benzoyl Peroxide Salicylic Acid  Backache/Headache: Tylenol: 2 regular strength every 4 hours OR              2 Extra strength every 6 hours  Colds/Coughs/Allergies: Benadryl (alcohol free) 25 mg every 6 hours as needed Breath right strips Claritin Cepacol throat lozenges Chloraseptic throat spray Cold-Eeze- up to three times per day Cough drops, alcohol free Flonase (by prescription only) Guaifenesin Mucinex Robitussin DM (plain only, alcohol free) Saline nasal spray/drops Sudafed (pseudoephedrine) & Actifed ** use only after [redacted] weeks gestation and if you do not have high blood pressure Tylenol Vicks Vaporub Zinc lozenges Zyrtec   Constipation: Colace Ducolax suppositories Fleet enema Glycerin suppositories Metamucil Milk of magnesia Miralax Senokot Smooth move tea  Diarrhea: Kaopectate Imodium A-D  *NO pepto Bismol  Hemorrhoids: Anusol Anusol HC Preparation H Tucks  Indigestion: Tums Maalox Mylanta Zantac  Pepcid  Insomnia: Benadryl (alcohol free) 25mg every 6 hours as needed Tylenol PM Unisom, no Gelcaps  Leg Cramps: Tums MagGel  Nausea/Vomiting:  Bonine Dramamine Emetrol Ginger extract Sea bands Meclizine  Nausea medication to take during pregnancy:  Unisom (doxylamine succinate 25 mg tablets) Take one tablet daily at bedtime. If symptoms are not adequately controlled, the dose can be increased to a maximum recommended dose of two tablets daily (1/2 tablet in the morning, 1/2 tablet mid-afternoon and one at bedtime). Vitamin B6 100mg tablets. Take one tablet twice a day (up to 200 mg per day).  Skin Rashes: Aveeno products Benadryl cream or 25mg every 6 hours as needed Calamine Lotion 1% cortisone cream  Yeast infection: Gyne-lotrimin 7 Monistat 7   **If taking multiple medications, please check labels to avoid duplicating the same active ingredients **take medication as directed on  the label ** Do not exceed 4000 mg of tylenol in 24 hours **Do not take medications that contain aspirin or ibuprofen     Second Trimester of Pregnancy  The second trimester is from week 14 through week 27 (month 4 through 6). This is often the time in pregnancy that you feel your best. Often times, morning sickness has lessened or quit. You may have more energy, and you may get hungry more often. Your unborn baby is growing rapidly. At the end of the sixth month, he or she is about 9 inches long and weighs about 1 pounds. You will likely feel the baby move between 18 and 20 weeks of pregnancy. Follow these instructions at home: Medicines  Take over-the-counter and prescription medicines only as told by your doctor. Some medicines are safe and some medicines are not safe during pregnancy.  Take a prenatal vitamin that contains at least 600 micrograms (mcg) of folic acid.  If you have trouble pooping (constipation), take medicine that will make your stool soft (stool softener) if your doctor approves. Eating and drinking   Eat regular, healthy meals.  Avoid raw meat and uncooked cheese.  If you get low calcium from the food you eat, talk to your doctor about taking a daily calcium supplement.  Avoid foods that are high in fat and sugars, such as fried and sweet foods.  If you feel sick to your stomach (nauseous) or throw up (vomit): ? Eat 4 or 5 small meals a day instead of 3 large meals. ? Try eating a few soda crackers. ? Drink liquids between meals instead of during meals.  To prevent constipation: ? Eat foods that are high in fiber, like fresh fruits   and vegetables, whole grains, and beans. ? Drink enough fluids to keep your pee (urine) clear or pale yellow. Activity  Exercise only as told by your doctor. Stop exercising if you start to have cramps.  Do not exercise if it is too hot, too humid, or if you are in a place of great height (high altitude).  Avoid heavy  lifting.  Wear low-heeled shoes. Sit and stand up straight.  You can continue to have sex unless your doctor tells you not to. Relieving pain and discomfort  Wear a good support bra if your breasts are tender.  Take warm water baths (sitz baths) to soothe pain or discomfort caused by hemorrhoids. Use hemorrhoid cream if your doctor approves.  Rest with your legs raised if you have leg cramps or low back pain.  If you develop puffy, bulging veins (varicose veins) in your legs: ? Wear support hose or compression stockings as told by your doctor. ? Raise (elevate) your feet for 15 minutes, 3-4 times a day. ? Limit salt in your food. Prenatal care  Write down your questions. Take them to your prenatal visits.  Keep all your prenatal visits as told by your doctor. This is important. Safety  Wear your seat belt when driving.  Make a list of emergency phone numbers, including numbers for family, friends, the hospital, and police and fire departments. General instructions  Ask your doctor about the right foods to eat or for help finding a counselor, if you need these services.  Ask your doctor about local prenatal classes. Begin classes before month 6 of your pregnancy.  Do not use hot tubs, steam rooms, or saunas.  Do not douche or use tampons or scented sanitary pads.  Do not cross your legs for long periods of time.  Visit your dentist if you have not done so. Use a soft toothbrush to brush your teeth. Floss gently.  Avoid all smoking, herbs, and alcohol. Avoid drugs that are not approved by your doctor.  Do not use any products that contain nicotine or tobacco, such as cigarettes and e-cigarettes. If you need help quitting, ask your doctor.  Avoid cat litter boxes and soil used by cats. These carry germs that can cause birth defects in the baby and can cause a loss of your baby (miscarriage) or stillbirth. Contact a doctor if:  You have mild cramps or pressure in your  lower belly.  You have pain when you pee (urinate).  You have bad smelling fluid coming from your vagina.  You continue to feel sick to your stomach (nauseous), throw up (vomit), or have watery poop (diarrhea).  You have a nagging pain in your belly area.  You feel dizzy. Get help right away if:  You have a fever.  You are leaking fluid from your vagina.  You have spotting or bleeding from your vagina.  You have severe belly cramping or pain.  You lose or gain weight rapidly.  You have trouble catching your breath and have chest pain.  You notice sudden or extreme puffiness (swelling) of your face, hands, ankles, feet, or legs.  You have not felt the baby move in over an hour.  You have severe headaches that do not go away when you take medicine.  You have trouble seeing. Summary  The second trimester is from week 14 through week 27 (months 4 through 6). This is often the time in pregnancy that you feel your best.  To take care of yourself and  your unborn baby, you will need to eat healthy meals, take medicines only if your doctor tells you to do so, and do activities that are safe for you and your baby.  Call your doctor if you get sick or if you notice anything unusual about your pregnancy. Also, call your doctor if you need help with the right food to eat, or if you want to know what activities are safe for you. This information is not intended to replace advice given to you by your health care provider. Make sure you discuss any questions you have with your health care provider. Document Released: 11/22/2009 Document Revised: 12/20/2018 Document Reviewed: 10/03/2016 Elsevier Patient Education  2020 [employer]  

## 2019-08-26 NOTE — Progress Notes (Signed)
Pt presents for NOB visit. Pt c/o recurrent sinus infections.  Office supply Makena given R arm w/o difficulty.

## 2019-08-26 NOTE — Progress Notes (Signed)
History:   [name] is a 33 y.o. B2E1007 at 19w0dby early ultrasound being seen today for her first obstetrical visit.  Her obstetrical history is significant for hx of cesarean section and preterm delivery . Patient does intend to breast feed. Pregnancy history fully reviewed.  Patient reports pelvic pain- reports it feels like her "hips are spreading already".     HISTORY: OB History  Gravida Para Term Preterm AB Living  _0 SAB TAB Ectopic Multiple Live Births  0 3 0 0 2    # Outcome Date GA Lbr Len/2nd Weight Sex Delivery Anes PTL Lv  7 Current           6 Preterm 06/04/08 270w0d M CS-LTranv   ND  5 Term 07/05/06    F Vag-Spont   LIV  4 AB           3 TAB           2 TAB           1 TAB             Last pap smear was done 2012 and was normal  Past Medical History:  Diagnosis Date  . Chlamydia    age 33. Recurrent sinus infections 04/12/2016  . Seasonal allergies    Past Surgical History:  Procedure Laterality Date  . APPENDECTOMY    . CESAREAN SECTION     Family History  Problem Relation Age of Onset  . Hypertension Mother   . Hypertension Father   . Diabetes Father   . Hypertension Sister   . Asthma Sister   . Hypertension Brother   . Asthma Brother   . Asthma Daughter    Social History   Tobacco Use  . Smoking status: Former Smoker    Packs/day: 0.50    Years: 10.00    Pack years: 5.00    Types: Cigarettes  . Smokeless tobacco: Never Used  Substance Use Topics  . Alcohol use: No  . Drug use: No   Allergies  Allergen Reactions  . Shellfish Allergy     POSITIVE ALLERGY TEST   Current Outpatient Medications on File Prior to Visit  Medication Sig Dispense Refill  . acetaminophen (TYLENOL) 500 MG tablet Take 500 mg by mouth every 6 (six) hours as needed for moderate pain.    . cetirizine (ZYRTEC) 10 MG tablet Take 10 mg by mouth daily.    . diphenhydrAMINE (BENADRYL) 25 MG tablet Take 25 mg by mouth.    . Prenatal  Vit-Fe Fumarate-FA (PRENATAL MULTIVITAMIN) TABS tablet Take 1 tablet by mouth daily at 12 noon.    . fluticasone (FLONASE) 50 MCG/ACT nasal spray Place 1 spray into both nostrils daily. (Patient not taking: Reported on 08/26/2019) 11 mL 2  . pseudoephedrine (SUDAFED) 30 MG tablet Take 30 mg by mouth every 4 (four) hours as needed for congestion.     No current facility-administered medications on file prior to visit.    Review of Systems Pertinent items noted in HPI and remainder of comprehensive ROS otherwise negative. Physical Exam:   Vitals:   08/26/19 0822  BP: 102/67  Pulse: (!) 120  Temp: 99 F (37.2 C)  Weight: 170 lb (77.1 kg)   Fetal Heart Rate (bpm): 165 Pelvic Exam: Perineum: no hemorrhoids, normal perineum   Vulva:  normal external genitalia, no lesions   Vagina:  White curdy discharge noted on  labia majora, normal mucosa, normal discharge   Cervix: no lesions and normal, pap smear done.    Adnexa: normal adnexa and no mass, fullness, tenderness   Bony Pelvis: average  System: General: well-developed, well-nourished female in no acute distress   Breasts:  normal appearance, no masses or tenderness bilaterally   Skin: normal coloration and turgor, no rashes   Neurologic: oriented, normal, negative, normal mood   Extremities: normal strength, tone, and muscle mass, ROM of all joints is normal   HEENT PERRLA, extraocular movement intact and sclera clear   Mouth/Teeth mucous membranes moist, pharynx normal without lesions and dental hygiene good   Neck supple and no masses   Cardiovascular: regular rate and rhythm   Respiratory:  no respiratory distress, normal breath sounds   Abdomen: soft, non-tender; bowel sounds normal; no masses,  no organomegaly     Assessment:    Pregnancy: [id number] Patient Active Problem List   Diagnosis Date Noted  . Supervision of high risk pregnancy, antepartum 08/26/2019  . Trichomonal vaginitis during pregnancy in first trimester  08/26/2019  . Hx of cesarean section complicating pregnancy 36/14/4315  . History of preterm delivery 08/26/2019  . Tobacco use 04/12/2016  . Acute sinusitis 01/24/2016  . Perennial and seasonal allergic rhinitis 01/24/2016  . Coughing/wheezing 01/24/2016  . Seasonal allergic conjunctivitis 01/24/2016     Plan:    1. Supervision of other normal pregnancy, antepartum - Welcomed to practice and introduced self to patient  - Discussed COVID 19 and pregnancy and what to expect with prenatal appointments - Anticipatory guidance on upcoming appointments including virtual appointments  - Blood Pressure Monitoring (BLOOD PRESSURE KIT) DEVI; 1 Device by Does not apply route as needed.  Dispense: 1 each; Refill: 0 - Obstetric Panel (and HIV) - Culture, OB Urine - AFP only (15.0-21.6) - Panorama or Harmony - Optimized Schedule - Babyscripts APP only - Cytology - PAP( [place]) >30 - [place] MFM OB DETAIL +14 W[place]; Future  2. Recurrent sinus infections - Ambulatory referral to ENT  3. Trichomonal vaginitis during pregnancy in first trimester - TOC obtained today  - Cervicovaginal ancillary only( [place])  4. Hx of cesarean section complicating pregnancy - Hx of cesarean delivery with 28 week delivery in 2009  - Patient plans repeat C/S  - [place] MFM OB DETAIL +14 [place]; Future  5. Vulvovaginal candidiasis - Patient reports since taking medication for Trich she has noticed itching and irritation - will treat for yeast infection presumptively  - terconazole (TERAZOL 7) 0.4 % vaginal cream; Place 1 applicator vaginally at bedtime. Use for seven days  Dispense: 45 g; Refill: 0  6. History of preterm delivery - Patient reports hx of preterm delivery with C/S in 2009  - Patient reports going to [facility] for vaginal bleeding and cervix started to change and continued until delivery  - No records in chart review prior to 2010 , patient reports C/S was performed at [facility] and  discussed Makena inj d/t hx of PTD - patient agrees to starting injections, first dose given at today's visit  - [place] MFM OB DETAIL +14 [place]; Future   Initial labs drawn. Continue prenatal vitamins. Genetic Screening discussed, AFP and NIPS: ordered. Ultrasound discussed; fetal anatomic survey: ordered. Problem list reviewed and updated. The nature of [facility] with multiple MDs and other Advanced Practice Providers was explained to patient; also emphasized that residents, students are part of our team. Routine  obstetric precautions reviewed. Return in about 4 weeks (around 09/23/2019) for [place].     Lajean Manes, [place] for [employer], [place]

## 2019-08-27 LAB — CERVICOVAGINAL ANCILLARY ONLY
Bacterial Vaginitis (gardnerella): NEGATIVE
Candida Glabrata: NEGATIVE
Candida Vaginitis: NEGATIVE
Chlamydia: NEGATIVE
Comment: NEGATIVE
Comment: NEGATIVE
Comment: NEGATIVE
Comment: NEGATIVE
Comment: NEGATIVE
Comment: NORMAL
Neisseria Gonorrhea: NEGATIVE
Trichomonas: NEGATIVE

## 2019-08-28 LAB — OBSTETRIC PANEL, INCLUDING HIV
Antibody Screen: NEGATIVE
Basophils Absolute: 0 10*3/uL (ref 0.0–0.2)
Basos: 0 %
EOS (ABSOLUTE): 0.2 10*3/uL (ref 0.0–0.4)
Eos: 2 %
HIV Screen 4th Generation wRfx: NONREACTIVE
Hematocrit: 35.2 % (ref 34.0–46.6)
Hemoglobin: 12.1 g/dL (ref 11.1–15.9)
Hepatitis B Surface Ag: NEGATIVE
Immature Grans (Abs): 0 10*3/uL (ref 0.0–0.1)
Immature Granulocytes: 0 %
Lymphocytes Absolute: 1.3 10*3/uL (ref 0.7–3.1)
Lymphs: 19 %
MCH: 32 pg (ref 26.6–33.0)
MCHC: 34.4 g/dL (ref 31.5–35.7)
MCV: 93 fL (ref 79–97)
Monocytes Absolute: 0.5 10*3/uL (ref 0.1–0.9)
Monocytes: 7 %
Neutrophils Absolute: 4.9 10*3/uL (ref 1.4–7.0)
Neutrophils: 72 %
Platelets: 192 10*3/uL (ref 150–450)
RBC: 3.78 x10E6/uL (ref 3.77–5.28)
RDW: 12.4 % (ref 11.7–15.4)
RPR Ser Ql: NONREACTIVE
Rh Factor: POSITIVE
Rubella Antibodies, IGG: 3.29 index (ref >0.99)
WBC: 6.9 10*3/uL (ref 3.4–10.8)

## 2019-08-28 LAB — CYTOLOGY - PAP
Comment: NEGATIVE
Comment: NEGATIVE
Diagnosis: NEGATIVE
HPV 16: NEGATIVE
HPV 18 / 45: NEGATIVE
High risk HPV: POSITIVE — AB

## 2019-08-28 LAB — AFP, SERUM, OPEN SPINA BIFIDA
AFP MoM: 1.65
AFP Value: 63.7 ng/mL
Gest. Age on Collection Date: 17 weeks
Maternal Age At EDD: 33 yr
OSBR Risk 1 IN: 3696
Test Results:: NEGATIVE
Weight: 170 [lb_av]

## 2019-08-30 LAB — URINE CULTURE, OB REFLEX

## 2019-08-30 LAB — CULTURE, OB URINE

## 2019-08-31 MED ORDER — CEFADROXIL 500 MG PO CAPS: 500.0000 mg | ORAL_CAPSULE | Freq: Two times a day (BID) | ORAL | 0 refills | Status: DC

## 2019-08-31 NOTE — Addendum Note (Signed)
Addended by: Lajean Manes on: 08/31/2019 02:14 AM   Modules accepted: Orders

## 2019-09-01 ENCOUNTER — Other Ambulatory Visit: Payer: Self-pay

## 2019-09-01 DIAGNOSIS — Z3480 Encounter for supervision of other normal pregnancy, unspecified trimester: Secondary | ICD-10-CM

## 2019-09-01 MED ORDER — MISC. DEVICES MISC: 0 refills | Status: DC

## 2019-09-01 NOTE — Progress Notes (Signed)
Rx order printed as directed.

## 2019-09-02 ENCOUNTER — Ambulatory Visit: Payer: Medicaid Other

## 2019-09-02 ENCOUNTER — Telehealth: Payer: Self-pay

## 2019-09-02 ENCOUNTER — Other Ambulatory Visit: Payer: Self-pay

## 2019-09-02 DIAGNOSIS — Z8751 Personal history of pre-term labor: Secondary | ICD-10-CM

## 2019-09-02 MED ORDER — HYDROXYPROGESTERONE CAPROATE 275 MG/1.1ML ~~LOC~~ SOAJ
275.0000 mg | SUBCUTANEOUS | Status: DC
  Administered 2019-09-02 – 2019-11-13 (×9): 275 mg via SUBCUTANEOUS

## 2019-09-02 NOTE — Progress Notes (Signed)
I have reviewed this chart and agree with the RN/CMA assessment and management.    K. Meryl Evin Chirco, M.D. Attending [facility] (Faculty Practice)   

## 2019-09-02 NOTE — Telephone Encounter (Signed)
Returned call and answered pt questions about [facility]

## 2019-09-02 NOTE — Progress Notes (Signed)
Pt is in the office for 17-p injection, administered in L arm and pt tolerated well. .. Administrations This Visit    HYDROXYprogesterone Caproate SOAJ 275 mg    Admin Date 09/02/2019 Action Given Dose 275 mg Route Subcutaneous Administered By Hinton Lovely, RN

## 2019-09-03 ENCOUNTER — Encounter: Payer: Self-pay | Admitting: Certified Nurse Midwife

## 2019-09-08 ENCOUNTER — Ambulatory Visit: Payer: Medicaid Other

## 2019-09-08 ENCOUNTER — Encounter: Payer: Self-pay | Admitting: Certified Nurse Midwife

## 2019-09-09 ENCOUNTER — Other Ambulatory Visit (HOSPITAL_COMMUNITY): Payer: Self-pay | Admitting: *Deleted

## 2019-09-09 ENCOUNTER — Encounter: Payer: Self-pay | Admitting: Certified Nurse Midwife

## 2019-09-09 ENCOUNTER — Encounter (HOSPITAL_COMMUNITY): Payer: Self-pay

## 2019-09-09 ENCOUNTER — Ambulatory Visit (HOSPITAL_COMMUNITY): Payer: Medicaid Other | Admitting: *Deleted

## 2019-09-09 ENCOUNTER — Other Ambulatory Visit: Payer: Self-pay

## 2019-09-09 ENCOUNTER — Ambulatory Visit (HOSPITAL_COMMUNITY)
Admission: RE | Admit: 2019-09-09 | Discharge: 2019-09-09 | Disposition: A | Discharge status: Home / Self Care | Payer: Medicaid Other | Source: Ambulatory Visit | Attending: Certified Nurse Midwife | Admitting: Certified Nurse Midwife

## 2019-09-09 DIAGNOSIS — Z3480 Encounter for supervision of other normal pregnancy, unspecified trimester: Secondary | ICD-10-CM | POA: Diagnosis present

## 2019-09-09 DIAGNOSIS — O99212 Obesity complicating pregnancy, second trimester: Secondary | ICD-10-CM

## 2019-09-09 DIAGNOSIS — O09892 Supervision of other high risk pregnancies, second trimester: Secondary | ICD-10-CM | POA: Insufficient documentation

## 2019-09-09 DIAGNOSIS — O4402 Placenta previa specified as without hemorrhage, second trimester: Secondary | ICD-10-CM | POA: Insufficient documentation

## 2019-09-09 DIAGNOSIS — Z3A19 19 weeks gestation of pregnancy: Secondary | ICD-10-CM

## 2019-09-09 DIAGNOSIS — O34219 Maternal care for unspecified type scar from previous cesarean delivery: Secondary | ICD-10-CM

## 2019-09-09 DIAGNOSIS — O09212 Supervision of pregnancy with history of pre-term labor, second trimester: Secondary | ICD-10-CM | POA: Diagnosis not present

## 2019-09-09 DIAGNOSIS — Z362 Encounter for other antenatal screening follow-up: Secondary | ICD-10-CM

## 2019-09-11 ENCOUNTER — Other Ambulatory Visit: Payer: Self-pay

## 2019-09-11 ENCOUNTER — Ambulatory Visit (INDEPENDENT_AMBULATORY_CARE_PROVIDER_SITE_OTHER): Payer: Medicaid Other | Admitting: *Deleted

## 2019-09-11 DIAGNOSIS — Z8751 Personal history of pre-term labor: Secondary | ICD-10-CM

## 2019-09-11 DIAGNOSIS — O0990 Supervision of high risk pregnancy, unspecified, unspecified trimester: Secondary | ICD-10-CM

## 2019-09-11 DIAGNOSIS — Z3A19 19 weeks gestation of pregnancy: Secondary | ICD-10-CM | POA: Diagnosis not present

## 2019-09-11 DIAGNOSIS — O09212 Supervision of pregnancy with history of pre-term labor, second trimester: Secondary | ICD-10-CM | POA: Diagnosis not present

## 2019-09-11 NOTE — Progress Notes (Signed)
Patient seen and assessed by nursing staff during this encounter. I have reviewed the chart and agree with the documentation and plan.  Taylon Louison, MD 09/11/2019 10:48 AM    

## 2019-09-11 NOTE — Progress Notes (Signed)
Pt is in office for 17p injection.   Pt tolerated injection well.  Pt has no other concerns today.   Administrations This Visit    HYDROXYprogesterone Caproate SOAJ 275 mg    Admin Date 09/11/2019 Action Given Dose 275 mg Route Subcutaneous Administered By Valene Bors, CMA

## 2019-09-16 ENCOUNTER — Ambulatory Visit: Payer: Medicaid Other

## 2019-09-18 ENCOUNTER — Ambulatory Visit (INDEPENDENT_AMBULATORY_CARE_PROVIDER_SITE_OTHER): Payer: Medicaid Other

## 2019-09-18 ENCOUNTER — Other Ambulatory Visit: Payer: Self-pay

## 2019-09-18 DIAGNOSIS — Z3A20 20 weeks gestation of pregnancy: Secondary | ICD-10-CM | POA: Diagnosis not present

## 2019-09-18 DIAGNOSIS — O09212 Supervision of pregnancy with history of pre-term labor, second trimester: Secondary | ICD-10-CM

## 2019-09-18 DIAGNOSIS — Z8751 Personal history of pre-term labor: Secondary | ICD-10-CM

## 2019-09-18 NOTE — Progress Notes (Signed)
Makena given R arm w/o complaints Pt said L arm still swollen from last injection, skin WDI.

## 2019-09-22 ENCOUNTER — Telehealth: Payer: Medicaid Other | Admitting: Obstetrics and Gynecology

## 2019-09-23 ENCOUNTER — Ambulatory Visit: Payer: Medicaid Other

## 2019-09-25 ENCOUNTER — Other Ambulatory Visit: Payer: Self-pay

## 2019-09-25 ENCOUNTER — Ambulatory Visit (INDEPENDENT_AMBULATORY_CARE_PROVIDER_SITE_OTHER): Payer: Medicaid Other | Admitting: Family Medicine

## 2019-09-25 VITALS — BP 115/75 | HR 103 | Wt 185.6 lb

## 2019-09-25 DIAGNOSIS — O0992 Supervision of high risk pregnancy, unspecified, second trimester: Secondary | ICD-10-CM

## 2019-09-25 DIAGNOSIS — Z8751 Personal history of pre-term labor: Secondary | ICD-10-CM

## 2019-09-25 DIAGNOSIS — O4402 Placenta previa specified as without hemorrhage, second trimester: Secondary | ICD-10-CM

## 2019-09-25 DIAGNOSIS — O34219 Maternal care for unspecified type scar from previous cesarean delivery: Secondary | ICD-10-CM

## 2019-09-25 DIAGNOSIS — Z3A21 21 weeks gestation of pregnancy: Secondary | ICD-10-CM

## 2019-09-25 DIAGNOSIS — O0990 Supervision of high risk pregnancy, unspecified, unspecified trimester: Secondary | ICD-10-CM

## 2019-09-25 DIAGNOSIS — O09212 Supervision of pregnancy with history of pre-term labor, second trimester: Secondary | ICD-10-CM

## 2019-09-25 MED ORDER — HYDROXYPROGESTERONE CAPROATE 275 MG/1.1ML ~~LOC~~ SOAJ
275.0000 mg | Freq: Once | SUBCUTANEOUS | Status: AC
  Administered 2019-09-25: 275 mg via SUBCUTANEOUS

## 2019-09-25 NOTE — Progress Notes (Signed)
Pt presents for ROB. Pt given 17p. Makena rx refill called in.

## 2019-09-25 NOTE — Patient Instructions (Signed)
Breastfeeding and Self-Care It is normal to have some problems when you start to breastfeed your new baby. But there are things that you can do to take care of yourself and help prevent many common problems. This includes keeping your breasts healthy and making sure that your baby's mouth attaches (latches) properly to your nipple for feedings. Work with your doctor or breastfeeding specialist (lactation consultant) to find what works best for you. Follow these instructions at home: Breastfeeding strategy   Always make sure that your baby latches properly to breastfeed.  Make sure that your baby is in a proper position. Try different breastfeeding positions to find one that works best for you and your baby.  Breastfeed when you feel like you need to make your breasts less full or when your baby shows signs of hunger. This is called "breastfeeding on demand."  Do not delay feedings.  Try to relax when it is time to feed your baby. This helps your body release milk from your breast.  To help increase milk flow: ? Remove a small amount of milk from your breast right before breastfeeding. Do this using a pump or by squeezing with your hand. ? Apply warm, moist heat to your breast right before feeding. You can do this in the shower or with hand towels soaked with warm water. ? Massage your breast right before or during feeding. Breast care   To help your breasts stay healthy and keep them from getting too dry: ? Avoid using soap on your nipples. ? Let your nipples air-dry for 3-4 minutes after each feeding. ? Use only cotton bra pads to soak up breast milk that leaks. Be sure to change the pads if they become soaked with milk. If you use bra pads that can be thrown away, change them often. ? Put some lanolin on your nipples after breastfeeding. Pure lanolin does not need to be washed off your nipple before you feed your baby again. Pure lanolin is not harmful to your baby. ? Rub some breast  milk into your nipples:  Use your hand to squeeze out a few drops of breast milk.  Gently massage the milk into your nipples.  Let your nipples air-dry.  Wear a supportive nursing bra. Avoid wearing: ? Tight clothing. ? Underwire bras or bras that put pressure on your breasts.  Use ice to help relieve pain or swelling of your breasts: ? Put ice in a plastic bag. ? Place a towel between your skin and the bag. ? Leave the ice on for 20 minutes, 2-3 times a day. General instructions  Drink enough fluid to keep your pee (urine) pale yellow.  Get plenty of rest. Sleep when your baby sleeps.  Talk to your doctor or breastfeeding specialist before taking any herbal supplements. Contact a health care provider if:  You have nipple pain.  You have cracking or soreness in your nipples that lasts longer than 1 week.  Your breasts are overfilled with milk (engorgement) and this lasts longer than 48 hours.  You have a fever.  You have pus-like fluid coming from your nipple.  You have redness, a rash, swelling, itching, or burning on your breast.  Your baby does not gain weight.  Your baby loses weight. Summary  There are things that you can do to take care of yourself and help prevent many common breastfeeding problems.  Always make sure that your baby's mouth attaches (latches) to your nipple properly to breastfeed.  Keep your nipples   from getting too dry, drink plenty of fluid, and get plenty of rest.  Feed on demand. Do not delay feedings. This information is not intended to replace advice given to you by your health care provider. Make sure you discuss any questions you have with your health care provider. Document Revised: 12/18/2018 Document Reviewed: 04/04/2017 Elsevier Patient Education  2020 Elsevier Inc.  

## 2019-09-25 NOTE — Progress Notes (Signed)
Subjective:  [name] is a 34 y.o. L8V5643 at [redacted]w[redacted]d being seen today for ongoing prenatal care.  She is currently monitored for the following issues for this high-risk pregnancy and has Acute sinusitis; Perennial and seasonal allergic rhinitis; Coughing/wheezing; Seasonal allergic conjunctivitis; Tobacco use; Supervision of high risk pregnancy, antepartum; Trichomonal vaginitis during pregnancy in first trimester; Hx of cesarean section complicating pregnancy; History of preterm delivery; and Placenta previa antepartum in second trimester on their problem list.  Patient reports no complaints.  Contractions: Not present. Vag. Bleeding: None.  Movement: Present. Denies leaking of fluid.   The following portions of the patient's history were reviewed and updated as appropriate: allergies, current medications, past family history, past medical history, past social history, past surgical history and problem list. Problem list updated.  Objective:   Vitals:   09/25/19 1439  BP: 115/75  Pulse: (!) 103  Weight: 185 lb 9.6 oz (84.2 kg)    Fetal Status: Fetal Heart Rate (bpm): 165 Fundal Height: 21 cm Movement: Present     General:  Alert, oriented and cooperative. Patient is in no acute distress.  Skin: Skin is warm and dry. No rash noted.   Cardiovascular: Normal heart rate noted  Respiratory: Normal respiratory effort, no problems with respiration noted  Abdomen: Soft, gravid, appropriate for gestational age. Pain/Pressure: Present     Pelvic: Vag. Bleeding: None     Cervical exam deferred        Extremities: Normal range of motion.  Edema: None  Mental Status: Normal mood and affect. Normal behavior. Normal judgment and thought content.   Urinalysis:      Assessment and Plan:  Pregnancy: P2R5188 at [redacted]w[redacted]d  1. Supervision of high risk pregnancy, antepartum - Continue routine prenatal care  2. Hx of cesarean section complicating pregnancy - Desires repeat, thinking of  BTL  3. History of preterm delivery - 17P given in office today  4. Placenta previa antepartum in second trimester - Diagnosed at anatomy scan - Educated on diagnosis - Follow up [facility] on 10/07/19  Preterm labor symptoms and general obstetric precautions including but not limited to vaginal bleeding, contractions, leaking of fluid and fetal movement were reviewed in detail with the patient. Please refer to After Visit Summary for other counseling recommendations.  Return in about 4 weeks (around 10/23/2019) for [facility].   Jaidan Prevette L, DO

## 2019-10-02 ENCOUNTER — Other Ambulatory Visit: Payer: Self-pay

## 2019-10-02 ENCOUNTER — Ambulatory Visit (INDEPENDENT_AMBULATORY_CARE_PROVIDER_SITE_OTHER): Payer: Medicaid Other

## 2019-10-02 DIAGNOSIS — O09212 Supervision of pregnancy with history of pre-term labor, second trimester: Secondary | ICD-10-CM

## 2019-10-02 DIAGNOSIS — Z3A22 22 weeks gestation of pregnancy: Secondary | ICD-10-CM | POA: Diagnosis not present

## 2019-10-02 DIAGNOSIS — Z8751 Personal history of pre-term labor: Secondary | ICD-10-CM

## 2019-10-02 MED ORDER — HYDROXYPROGESTERONE CAPROATE 275 MG/1.1ML ~~LOC~~ SOAJ
275.0000 mg | Freq: Once | SUBCUTANEOUS | Status: AC
  Administered 2019-10-02: 10:00:00 275 mg via SUBCUTANEOUS

## 2019-10-02 NOTE — Progress Notes (Signed)
[name] is here for a 17-P injection. Pt tolerated injection well in the left arm without complication. After given the injection pt reports having side effects after each injection consisting of nausea, vomiting, diarrhea, headache, chills, and extreme fatigue with weakness in her legs. She said this happens after very injection maybe 3/4 hours after.  She takes an 1.5/2 hour nap to help work off the sx. Consulted with Dr. Clearance Coots. Per Clearance Coots pt should try to take benadryl 25 mg before each injection. Pt notified. Pt advised to make next appointment in 1 week. She verbalized understanding. -EH/RMA

## 2019-10-07 ENCOUNTER — Other Ambulatory Visit: Payer: Self-pay

## 2019-10-07 ENCOUNTER — Ambulatory Visit (HOSPITAL_COMMUNITY)
Admission: RE | Admit: 2019-10-07 | Discharge: 2019-10-07 | Disposition: A | Discharge status: Home / Self Care | Payer: Medicaid Other | Source: Ambulatory Visit | Attending: Obstetrics and Gynecology | Admitting: Obstetrics and Gynecology

## 2019-10-07 ENCOUNTER — Encounter (HOSPITAL_COMMUNITY): Payer: Self-pay

## 2019-10-07 ENCOUNTER — Ambulatory Visit (HOSPITAL_COMMUNITY): Payer: Medicaid Other | Admitting: *Deleted

## 2019-10-07 ENCOUNTER — Other Ambulatory Visit (HOSPITAL_COMMUNITY): Payer: Self-pay | Admitting: *Deleted

## 2019-10-07 DIAGNOSIS — O4402 Placenta previa specified as without hemorrhage, second trimester: Secondary | ICD-10-CM | POA: Insufficient documentation

## 2019-10-07 DIAGNOSIS — Z362 Encounter for other antenatal screening follow-up: Secondary | ICD-10-CM | POA: Insufficient documentation

## 2019-10-07 DIAGNOSIS — Z3A23 23 weeks gestation of pregnancy: Secondary | ICD-10-CM

## 2019-10-07 DIAGNOSIS — O09212 Supervision of pregnancy with history of pre-term labor, second trimester: Secondary | ICD-10-CM

## 2019-10-07 DIAGNOSIS — O4440 Low lying placenta NOS or without hemorrhage, unspecified trimester: Secondary | ICD-10-CM

## 2019-10-07 DIAGNOSIS — O34219 Maternal care for unspecified type scar from previous cesarean delivery: Secondary | ICD-10-CM | POA: Diagnosis present

## 2019-10-07 DIAGNOSIS — O99212 Obesity complicating pregnancy, second trimester: Secondary | ICD-10-CM | POA: Diagnosis not present

## 2019-10-09 ENCOUNTER — Other Ambulatory Visit: Payer: Self-pay

## 2019-10-09 ENCOUNTER — Ambulatory Visit (INDEPENDENT_AMBULATORY_CARE_PROVIDER_SITE_OTHER): Payer: Medicaid Other

## 2019-10-09 DIAGNOSIS — O09212 Supervision of pregnancy with history of pre-term labor, second trimester: Secondary | ICD-10-CM

## 2019-10-09 DIAGNOSIS — Z8751 Personal history of pre-term labor: Secondary | ICD-10-CM

## 2019-10-09 DIAGNOSIS — Z3A23 23 weeks gestation of pregnancy: Secondary | ICD-10-CM

## 2019-10-09 NOTE — Progress Notes (Signed)
I have reviewed this chart and agree with the RN/CMA assessment and management.    K. Meryl Kalena Mander, M.D. Attending [facility] ([facility]ulty [facility])   

## 2019-10-09 NOTE — Progress Notes (Signed)
Nurse visit for 17p, Makena given R arm without complaints.  Pt develops a short lasting rash between thighs after 17p injections. She took Benadryl prior to visit today and will c/b if she experiences another rash.

## 2019-10-10 NOTE — Progress Notes (Signed)
Patient ID: ANAPAOLA KINSEL, female   DOB: 1986/01/06, 34 y.o.   MRN: [id number] Patient seen and assessed by nursing staff during this encounter. I have reviewed the chart and agree with the documentation and plan.  Scheryl Darter, MD 10/10/2019 2:47 PM

## 2019-10-16 ENCOUNTER — Ambulatory Visit (INDEPENDENT_AMBULATORY_CARE_PROVIDER_SITE_OTHER): Payer: Medicaid Other

## 2019-10-16 ENCOUNTER — Other Ambulatory Visit: Payer: Self-pay

## 2019-10-16 DIAGNOSIS — Z3A24 24 weeks gestation of pregnancy: Secondary | ICD-10-CM | POA: Diagnosis not present

## 2019-10-16 DIAGNOSIS — O09212 Supervision of pregnancy with history of pre-term labor, second trimester: Secondary | ICD-10-CM

## 2019-10-16 DIAGNOSIS — Z8751 Personal history of pre-term labor: Secondary | ICD-10-CM

## 2019-10-16 NOTE — Progress Notes (Signed)
Patient ID: [name], female   DOB: 1986/01/13, 34 y.o.   MRN: [id number] Patient seen and assessed by nursing staff during this encounter. I have reviewed the chart and agree with the documentation and plan.  Scheryl Darter, MD 10/16/2019 5:05 PM

## 2019-10-16 NOTE — Progress Notes (Signed)
Pt is in the office for 17-p injection. Administered in L Arm and pt tolerated well. Pt states she has been taking benadryl to help with side effects of the injection. .. Administrations This Visit    HYDROXYprogesterone Caproate SOAJ 275 mg    Admin Date 10/16/2019 Action Given Dose 275 mg Route Subcutaneous Administered By Katrina Stack, RN

## 2019-10-23 ENCOUNTER — Other Ambulatory Visit: Payer: Self-pay

## 2019-10-23 ENCOUNTER — Ambulatory Visit (INDEPENDENT_AMBULATORY_CARE_PROVIDER_SITE_OTHER): Payer: Medicaid Other

## 2019-10-23 VITALS — BP 124/77 | HR 107 | Temp 98.5°F | Wt 185.6 lb

## 2019-10-23 DIAGNOSIS — J329 Chronic sinusitis, unspecified: Secondary | ICD-10-CM

## 2019-10-23 DIAGNOSIS — O0990 Supervision of high risk pregnancy, unspecified, unspecified trimester: Secondary | ICD-10-CM

## 2019-10-23 DIAGNOSIS — Z3A25 25 weeks gestation of pregnancy: Secondary | ICD-10-CM | POA: Diagnosis not present

## 2019-10-23 DIAGNOSIS — O26892 Other specified pregnancy related conditions, second trimester: Secondary | ICD-10-CM

## 2019-10-23 DIAGNOSIS — O09212 Supervision of pregnancy with history of pre-term labor, second trimester: Secondary | ICD-10-CM

## 2019-10-23 DIAGNOSIS — O0992 Supervision of high risk pregnancy, unspecified, second trimester: Secondary | ICD-10-CM

## 2019-10-23 DIAGNOSIS — O34219 Maternal care for unspecified type scar from previous cesarean delivery: Secondary | ICD-10-CM

## 2019-10-23 NOTE — Progress Notes (Signed)
PRENATAL VISIT NOTE  Subjective:  [name] is a 34 y.o. [id number] at [redacted]w[redacted]d who presents today for routine prenatal care.  She is currently being monitored for supervision of a high-risk pregnancy with problems as listed below.  Patient has no pregnancy related concerns and endorses fetal movement.  She denies vaginal concerns including discharge, bleeding, leaking, itching, and burning. She states she has had a decreased appetite and contributes this to her sinuses.  Patient states she takes benadryl and zyrtec without relief of symptoms.  Patient reports she has a history of frequent sinus infections and was referred to an ENT specialist, but has not made an appt. Patient expresses desire for BTL.   Patient Active Problem List   Diagnosis Date Noted  . Placenta previa antepartum in second trimester 09/09/2019  . Supervision of high risk pregnancy, antepartum 08/26/2019  . Trichomonal vaginitis during pregnancy in first trimester 08/26/2019  . Hx of cesarean section complicating pregnancy 08/26/2019  . History of preterm delivery 08/26/2019  . Tobacco use 04/12/2016  . Acute sinusitis 01/24/2016  . Perennial and seasonal allergic rhinitis 01/24/2016  . Coughing/wheezing 01/24/2016  . Seasonal allergic conjunctivitis 01/24/2016    The following portions of the patient's history were reviewed and updated as appropriate: allergies, current medications, past family history, past medical history, past social history, past surgical history and problem list. Problem list updated.  Objective:   Vitals:   10/23/19 0950  BP: 124/77  Pulse: (!) 107  Temp: 98.5 F (36.9 C)  Weight: 185 lb 9.6 oz (84.2 kg)    Fetal Status: Fetal Heart Rate (bpm): 160 Fundal Height: 26 cm Movement: Present     General:  Alert, oriented and cooperative. Patient is in no acute distress.  Skin: Skin is warm and dry.   Cardiovascular: Regular rate and rhythm.  Respiratory: Normal respiratory effort.  CTA-Bilaterally  Abdomen: Soft, gravid, appropriate for gestational age.  Pelvic: Cervical exam deferred        Extremities: Normal range of motion.  Edema: None  Mental Status: Normal mood and affect. Normal behavior. Normal judgment and thought content.   Assessment and Plan:  Pregnancy: [id number] at [redacted]w[redacted]d  1. Supervision of high risk pregnancy, antepartum -Reviewed Glucola appt preparation including fasting the night before and morning of.   -Discussed anticipated office time of 2.5-3 hours.  -Reviewed blood draw procedures and labs which also include check of iron level.  -Discussed how results of GTT are handled including diabetic education and BS testing for abnormal results and routine care for normal results.   2. Hx of cesarean section complicating pregnancy -Patient expresses desire for repeat C/S. -Informed that consult with MD is recommended. -Informed that she would be scheduled at 39 weeks and tubal would be performed at that time. -BTL papers to be signed today.  3. Recurrent sinus infections -Instructed to utilize referral for ENT specialist consult for sinus issues. -Discussed how pregnancy should not hinder evaluation greatly and establishing care ASAP is recommended.  -Patient verbalizes understanding and states she will make appt.    Preterm labor symptoms and general obstetric precautions including but not limited to vaginal bleeding, contractions, leaking of fluid and fetal movement were reviewed with the patient.  Please refer to After Visit Summary for other counseling recommendations.  Return in about 3 weeks (around 11/13/2019) for HR-ROB with GTT.  Future Appointments  Date Time Provider Department Center  11/18/2019  1:15 PM [facility] NURSE [facility]  11/18/2019  1:15 PM  [place], CNM 10/23/2019, 10:31 AM

## 2019-10-23 NOTE — Progress Notes (Signed)
Makena refilled. Should receive by 2/17 in office.

## 2019-10-23 NOTE — Patient Instructions (Signed)
Postpartum Tubal Ligation Postpartum tubal ligation (PPTL) is a procedure to close the fallopian tubes. This is done so that you cannot get pregnant. When the fallopian tubes are closed, the eggs that the ovaries release cannot enter the uterus, and sperm cannot reach the eggs. PPTL is done right after childbirth or 1-2 days after childbirth, before the uterus returns to its normal location. If you have a cesarean section, it can be performed at the same time as the procedure. Having this done after childbirth does not make your stay in the hospital longer. PPTL is sometimes called "getting your tubes tied." You should not have this procedure if you want to get pregnant again or if you are unsure about having more children. Tell a health care provider about:  Any allergies you have.  All medicines you are taking, including vitamins, herbs, eye drops, creams, and over-the-counter medicines.  Any problems you or family members have had with anesthetic medicines.  Any blood disorders you have.  Any surgeries you have had.  Any medical conditions you have or have had.  Any past pregnancies. What are the risks? Generally, this is a safe procedure. However, problems may occur, including:  Infection.  Bleeding.  Injury to other organs in the abdomen.  Side effects from anesthetic medicines.  Failure of the procedure. If this happens, you could get pregnant.  Having a fertilized egg attach outside the uterus (ectopic pregnancy). What happens before the procedure?  Ask your health care provider about: ? How much pain you can expect to have. ? What medicines you will be given for pain, especially if you are planning to breastfeed. What happens during the procedure? If you had a vaginal delivery:  You will be given one or more of the following: ? A medicine to help you relax (sedative). ? A medicine to numb the area (local anesthetic). ? A medicine to make you fall asleep (general  anesthetic). ? A medicine that is injected into an area of your body to numb everything below the injection site (regional anesthetic).  If you have been given a general anesthetic, a tube will be put down your throat to help you breathe.  An IV will be inserted into one of your veins.  Your bladder may be emptied with a small tube (catheter).  An incision will be made just below your belly button.  Your fallopian tubes will be located and brought up through the incision.  Your fallopian tubes will be tied off, burned (cauterized), or blocked with a clip, ring, or clamp. A small part in the center of each fallopian tube may be removed.  The incision will be closed with stitches (sutures).  A bandage (dressing) will be placed over the incision. If you had a cesarean delivery:  Tubal ligation will be done through the incision that was used for the cesarean delivery of your baby.  The incision will be closed with sutures.  A dressing will be placed over the incision. The procedure may vary among health care providers and hospitals. What happens after the procedure?  Your blood pressure, heart rate, breathing rate, and blood oxygen level will be monitored until you leave the hospital.  You will be given pain medicine as needed.  Do not drive for 24 hours if you were given a sedative during your procedure. Summary  Postpartum tubal ligation is a procedure that closes the fallopian tubes so you cannot get pregnant anymore.  This procedure is done while you are still   in the hospital after childbirth. If you have a cesarean section, it can be performed at the same time.  Having this done after childbirth does not make your stay in the hospital longer.  Postpartum tubal ligation is considered permanent. You should not have this procedure if you want to get pregnant again or if you are unsure about having more children.  Talk to your health care provider to see if this procedure is  right for you. This information is not intended to replace advice given to you by your health care provider. Make sure you discuss any questions you have with your health care provider. Document Revised: 02/10/2019 Document Reviewed: 07/18/2018 Elsevier Patient Education  2020 [employer]  

## 2019-10-23 NOTE — Progress Notes (Signed)
Pt presents for ROB and 17p. No concerns today per pt.  17p given R arm w/o complaints.

## 2019-10-29 ENCOUNTER — Other Ambulatory Visit: Payer: Self-pay

## 2019-10-29 ENCOUNTER — Ambulatory Visit (INDEPENDENT_AMBULATORY_CARE_PROVIDER_SITE_OTHER): Payer: Medicaid Other

## 2019-10-29 DIAGNOSIS — Z8751 Personal history of pre-term labor: Secondary | ICD-10-CM

## 2019-10-29 DIAGNOSIS — O09212 Supervision of pregnancy with history of pre-term labor, second trimester: Secondary | ICD-10-CM | POA: Diagnosis not present

## 2019-10-29 DIAGNOSIS — Z3A26 26 weeks gestation of pregnancy: Secondary | ICD-10-CM

## 2019-10-29 NOTE — Progress Notes (Signed)
Pt is in the office for 17-p injection, administered in L arm and pt tolerated well. .. Administrations This Visit    HYDROXYprogesterone Caproate SOAJ 275 mg    Admin Date 10/29/2019 Action Given Dose 275 mg Route Subcutaneous Administered By Nobie Alleyne D, RN         

## 2019-10-30 ENCOUNTER — Ambulatory Visit: Payer: Medicaid Other

## 2019-10-31 NOTE — Progress Notes (Signed)
Patient ID: [name], female   DOB: Oct 21, 1985, 34 y.o.   MRN: [id number] Patient seen and assessed by nursing staff during this encounter. I have reviewed the chart and agree with the documentation and plan.  Scheryl Darter, MD 10/31/2019 8:28 AM

## 2019-11-05 ENCOUNTER — Ambulatory Visit (INDEPENDENT_AMBULATORY_CARE_PROVIDER_SITE_OTHER): Payer: Medicaid Other

## 2019-11-05 ENCOUNTER — Other Ambulatory Visit: Payer: Self-pay

## 2019-11-05 VITALS — BP 102/62 | HR 91 | Wt 189.0 lb

## 2019-11-05 DIAGNOSIS — O09213 Supervision of pregnancy with history of pre-term labor, third trimester: Secondary | ICD-10-CM

## 2019-11-05 DIAGNOSIS — Z8751 Personal history of pre-term labor: Secondary | ICD-10-CM

## 2019-11-05 DIAGNOSIS — Z3A28 28 weeks gestation of pregnancy: Secondary | ICD-10-CM

## 2019-11-05 NOTE — Progress Notes (Signed)
OB presents for 17P injection, given in RA , tolerated well.  Next injection 11/11/2019  Administrations This Visit    HYDROXYprogesterone Caproate SOAJ 275 mg    Admin Date 11/05/2019 Action Given Dose 275 mg Route Subcutaneous Administered By Maretta Bees, RMA

## 2019-11-13 ENCOUNTER — Other Ambulatory Visit: Payer: Self-pay

## 2019-11-13 ENCOUNTER — Encounter: Payer: Self-pay | Admitting: Obstetrics and Gynecology

## 2019-11-13 ENCOUNTER — Ambulatory Visit (INDEPENDENT_AMBULATORY_CARE_PROVIDER_SITE_OTHER): Payer: Medicaid Other | Admitting: Obstetrics and Gynecology

## 2019-11-13 ENCOUNTER — Other Ambulatory Visit: Payer: Medicaid Other

## 2019-11-13 VITALS — BP 110/77 | HR 103 | Wt 190.0 lb

## 2019-11-13 DIAGNOSIS — Z23 Encounter for immunization: Secondary | ICD-10-CM

## 2019-11-13 DIAGNOSIS — O0993 Supervision of high risk pregnancy, unspecified, third trimester: Secondary | ICD-10-CM

## 2019-11-13 DIAGNOSIS — K649 Unspecified hemorrhoids: Secondary | ICD-10-CM

## 2019-11-13 DIAGNOSIS — O4403 Placenta previa specified as without hemorrhage, third trimester: Secondary | ICD-10-CM

## 2019-11-13 DIAGNOSIS — O0990 Supervision of high risk pregnancy, unspecified, unspecified trimester: Secondary | ICD-10-CM

## 2019-11-13 DIAGNOSIS — O34219 Maternal care for unspecified type scar from previous cesarean delivery: Secondary | ICD-10-CM

## 2019-11-13 DIAGNOSIS — Z3009 Encounter for other general counseling and advice on contraception: Secondary | ICD-10-CM

## 2019-11-13 DIAGNOSIS — Z3A28 28 weeks gestation of pregnancy: Secondary | ICD-10-CM

## 2019-11-13 DIAGNOSIS — Z8751 Personal history of pre-term labor: Secondary | ICD-10-CM

## 2019-11-13 DIAGNOSIS — O09213 Supervision of pregnancy with history of pre-term labor, third trimester: Secondary | ICD-10-CM | POA: Diagnosis not present

## 2019-11-13 DIAGNOSIS — O4402 Placenta previa specified as without hemorrhage, second trimester: Secondary | ICD-10-CM

## 2019-11-13 HISTORY — DX: Unspecified hemorrhoids: K64.9

## 2019-11-13 MED ORDER — HYDROCORTISONE ACETATE 25 MG RE SUPP: 25.0000 mg | Freq: Two times a day (BID) | RECTAL | 1 refills | Status: DC

## 2019-11-13 NOTE — Progress Notes (Addendum)
HROB/GTT/17P.  BTL signed.  C/o hemorrhoids. TDAP given in LD, tolerated well. 17P given in RA, tolerated well.  Administrations This Visit    HYDROXYprogesterone Caproate SOAJ 275 mg    Admin Date 11/13/2019 Action Given Dose 275 mg Route Subcutaneous Administered By Maretta Bees, RMA          Patient changed her mind about giving herself the 17P Injections because her Apartment complex is relatively new and mail does not get delivered to her.  She will keep coming in for her injections for now.

## 2019-11-13 NOTE — Patient Instructions (Signed)
Third Trimester of Pregnancy The third trimester is from week 28 through week 40 (months 7 through 9). The third trimester is a time when the unborn baby (fetus) is growing rapidly. At the end of the ninth month, the fetus is about 20 inches in length and weighs 6-10 pounds. Body changes during your third trimester Your body will continue to go through many changes during pregnancy. The changes vary from woman to woman. During the third trimester:  Your weight will continue to increase. You can expect to gain 25-35 pounds (11-16 kg) by the end of the pregnancy.  You may begin to get stretch marks on your hips, abdomen, and breasts.  You may urinate more often because the fetus is moving lower into your pelvis and pressing on your bladder.  You may develop or continue to have heartburn. This is caused by increased hormones that slow down muscles in the digestive tract.  You may develop or continue to have constipation because increased hormones slow digestion and cause the muscles that push waste through your intestines to relax.  You may develop hemorrhoids. These are swollen veins (varicose veins) in the rectum that can itch or be painful.  You may develop swollen, bulging veins (varicose veins) in your legs.  You may have increased body aches in the pelvis, back, or thighs. This is due to weight gain and increased hormones that are relaxing your joints.  You may have changes in your hair. These can include thickening of your hair, rapid growth, and changes in texture. Some women also have hair loss during or after pregnancy, or hair that feels dry or thin. Your hair will most likely return to normal after your baby is born.  Your breasts will continue to grow and they will continue to become tender. A yellow fluid (colostrum) may leak from your breasts. This is the first milk you are producing for your baby.  Your belly button may stick out.  You may notice more swelling in your hands,  face, or ankles.  You may have increased tingling or numbness in your hands, arms, and legs. The skin on your belly may also feel numb.  You may feel short of breath because of your expanding uterus.  You may have more problems sleeping. This can be caused by the size of your belly, increased need to urinate, and an increase in your body's metabolism.  You may notice the fetus "dropping," or moving lower in your abdomen (lightening).  You may have increased vaginal discharge.  You may notice your joints feel loose and you may have pain around your pelvic bone. What to expect at prenatal visits You will have prenatal exams every 2 weeks until week 36. Then you will have weekly prenatal exams. During a routine prenatal visit:  You will be weighed to make sure you and the baby are growing normally.  Your blood pressure will be taken.  Your abdomen will be measured to track your baby's growth.  The fetal heartbeat will be listened to.  Any test results from the previous visit will be discussed.  You may have a cervical check near your due date to see if your cervix has softened or thinned (effaced).  You will be tested for Group B streptococcus. This happens between 35 and 37 weeks. Your health care provider may ask you:  What your birth plan is.  How you are feeling.  If you are feeling the baby move.  If you have had any abnormal   symptoms, such as leaking fluid, bleeding, severe headaches, or abdominal cramping.  If you are using any tobacco products, including cigarettes, chewing tobacco, and electronic cigarettes.  If you have any questions. Other tests or screenings that may be performed during your third trimester include:  Blood tests that check for low iron levels (anemia).  Fetal testing to check the health, activity level, and growth of the fetus. Testing is done if you have certain medical conditions or if there are problems during the pregnancy.  Nonstress test  (NST). This test checks the health of your baby to make sure there are no signs of problems, such as the baby not getting enough oxygen. During this test, a belt is placed around your belly. The baby is made to move, and its heart rate is monitored during movement. What is false labor? False labor is a condition in which you feel small, irregular tightenings of the muscles in the womb (contractions) that usually go away with rest, changing position, or drinking water. These are called Braxton Hicks contractions. Contractions may last for hours, days, or even weeks before true labor sets in. If contractions come at regular intervals, become more frequent, increase in intensity, or become painful, you should see your health care provider. What are the signs of labor?  Abdominal cramps.  Regular contractions that start at 10 minutes apart and become stronger and more frequent with time.  Contractions that start on the top of the uterus and spread down to the lower abdomen and back.  Increased pelvic pressure and dull back pain.  A watery or bloody mucus discharge that comes from the vagina.  Leaking of amniotic fluid. This is also known as your "water breaking." It could be a slow trickle or a gush. Let your health care provider know if it has a color or strange odor. If you have any of these signs, call your health care provider right away, even if it is before your due date. Follow these instructions at home: Medicines  Follow your health care provider's instructions regarding medicine use. Specific medicines may be either safe or unsafe to take during pregnancy.  Take a prenatal vitamin that contains at least 600 micrograms (mcg) of folic acid.  If you develop constipation, try taking a stool softener if your health care provider approves. Eating and drinking   Eat a balanced diet that includes fresh fruits and vegetables, whole grains, good sources of protein such as meat, eggs, or tofu,  and low-fat dairy. Your health care provider will help you determine the amount of weight gain that is right for you.  Avoid raw meat and uncooked cheese. These carry germs that can cause birth defects in the baby.  If you have low calcium intake from food, talk to your health care provider about whether you should take a daily calcium supplement.  Eat four or five small meals rather than three large meals a day.  Limit foods that are high in fat and processed sugars, such as fried and sweet foods.  To prevent constipation: ? Drink enough fluid to keep your urine clear or pale yellow. ? Eat foods that are high in fiber, such as fresh fruits and vegetables, whole grains, and beans. Activity  Exercise only as directed by your health care provider. Most women can continue their usual exercise routine during pregnancy. Try to exercise for 30 minutes at least 5 days a week. Stop exercising if you experience uterine contractions.  Avoid heavy lifting.  Do   not exercise in extreme heat or humidity, or at high altitudes.  Wear low-heel, comfortable shoes.  Practice good posture.  You may continue to have sex unless your health care provider tells you otherwise. Relieving pain and discomfort  Take frequent breaks and rest with your legs elevated if you have leg cramps or low back pain.  Take warm sitz baths to soothe any pain or discomfort caused by hemorrhoids. Use hemorrhoid cream if your health care provider approves.  Wear a good support bra to prevent discomfort from breast tenderness.  If you develop varicose veins: ? Wear support pantyhose or compression stockings as told by your healthcare provider. ? Elevate your feet for 15 minutes, 3-4 times a day. Prenatal care  Write down your questions. Take them to your prenatal visits.  Keep all your prenatal visits as told by your health care provider. This is important. Safety  Wear your seat belt at all times when driving.  Make  a list of emergency phone numbers, including numbers for family, friends, the hospital, and police and fire departments. General instructions  Avoid cat litter boxes and soil used by cats. These carry germs that can cause birth defects in the baby. If you have a cat, ask someone to clean the litter box for you.  Do not travel far distances unless it is absolutely necessary and only with the approval of your health care provider.  Do not use hot tubs, steam rooms, or saunas.  Do not drink alcohol.  Do not use any products that contain nicotine or tobacco, such as cigarettes and e-cigarettes. If you need help quitting, ask your health care provider.  Do not use any medicinal herbs or unprescribed drugs. These chemicals affect the formation and growth of the baby.  Do not douche or use tampons or scented sanitary pads.  Do not cross your legs for long periods of time.  To prepare for the arrival of your baby: ? Take prenatal classes to understand, practice, and ask questions about labor and delivery. ? Make a trial run to the hospital. ? Visit the hospital and tour the maternity area. ? Arrange for maternity or paternity leave through employers. ? Arrange for family and friends to take care of pets while you are in the hospital. ? Purchase a rear-facing car seat and make sure you know how to install it in your car. ? Pack your hospital bag. ? Prepare the baby's nursery. Make sure to remove all pillows and stuffed animals from the baby's crib to prevent suffocation.  Visit your dentist if you have not gone during your pregnancy. Use a soft toothbrush to brush your teeth and be gentle when you floss. Contact a health care provider if:  You are unsure if you are in labor or if your water has broken.  You become dizzy.  You have mild pelvic cramps, pelvic pressure, or nagging pain in your abdominal area.  You have lower back pain.  You have persistent nausea, vomiting, or  diarrhea.  You have an unusual or bad smelling vaginal discharge.  You have pain when you urinate. Get help right away if:  Your water breaks before 37 weeks.  You have regular contractions less than 5 minutes apart before 37 weeks.  You have a fever.  You are leaking fluid from your vagina.  You have spotting or bleeding from your vagina.  You have severe abdominal pain or cramping.  You have rapid weight loss or weight gain.  You have   shortness of breath with chest pain.  You notice sudden or extreme swelling of your face, hands, ankles, feet, or legs.  Your baby makes fewer than 10 movements in 2 hours.  You have severe headaches that do not go away when you take medicine.  You have vision changes. Summary  The third trimester is from week 28 through week 40, months 7 through 9. The third trimester is a time when the unborn baby (fetus) is growing rapidly.  During the third trimester, your discomfort may increase as you and your baby continue to gain weight. You may have abdominal, leg, and back pain, sleeping problems, and an increased need to urinate.  During the third trimester your breasts will keep growing and they will continue to become tender. A yellow fluid (colostrum) may leak from your breasts. This is the first milk you are producing for your baby.  False labor is a condition in which you feel small, irregular tightenings of the muscles in the womb (contractions) that eventually go away. These are called Braxton Hicks contractions. Contractions may last for hours, days, or even weeks before true labor sets in.  Signs of labor can include: abdominal cramps; regular contractions that start at 10 minutes apart and become stronger and more frequent with time; watery or bloody mucus discharge that comes from the vagina; increased pelvic pressure and dull back pain; and leaking of amniotic fluid. This information is not intended to replace advice given to you by your  health care provider. Make sure you discuss any questions you have with your health care provider. Document Revised: 12/19/2018 Document Reviewed: 10/03/2016 Elsevier Patient Education  2020 [employer]  

## 2019-11-13 NOTE — Progress Notes (Signed)
Subjective:  [name] is a 34 y.o. O1H0865 at [redacted]w[redacted]d being seen today for ongoing prenatal care.  She is currently monitored for the following issues for this high-risk pregnancy and has Perennial and seasonal allergic rhinitis; Seasonal allergic conjunctivitis; Tobacco use; Supervision of high risk pregnancy, antepartum; Hx of cesarean section complicating pregnancy; History of preterm delivery; and Placenta previa antepartum in second trimester on their problem list.  Patient reports hemmorroids.  Contractions: Not present. Vag. Bleeding: None.  Movement: Present. Denies leaking of fluid.   The following portions of the patient's history were reviewed and updated as appropriate: allergies, current medications, past family history, past medical history, past social history, past surgical history and problem list. Problem list updated.  Objective:   Vitals:   11/13/19 0937  BP: 110/77  Pulse: (!) 103  Weight: 190 lb (86.2 kg)    Fetal Status: Fetal Heart Rate (bpm): 159   Movement: Present     General:  Alert, oriented and cooperative. Patient is in no acute distress.  Skin: Skin is warm and dry. No rash noted.   Cardiovascular: Normal heart rate noted  Respiratory: Normal respiratory effort, no problems with respiration noted  Abdomen: Soft, gravid, appropriate for gestational age. Pain/Pressure: Absent     Pelvic:  Cervical exam deferred        Extremities: Normal range of motion.  Edema: None  Mental Status: Normal mood and affect. Normal behavior. Normal judgment and thought content.   Urinalysis:      Assessment and Plan:  Pregnancy: [id number] at [redacted]w[redacted]d  1. Supervision of high risk pregnancy, antepartum Stable - Glucose Tolerance, 2 Hours w/1 Hour - CBC - HIV antibody (with reflex) - RPR - Tdap vaccine greater than or equal to 7yo IM  2. History of preterm delivery Stable Continue with 17 OHP  3. Hx of cesarean section complicating pregnancy Desires repeat c  section Discussed with pt today   4. Placenta previa antepartum in second trimester F/U U/S 11/18/19  5. Unwanted fertility BTL papers today  Preterm labor symptoms and general obstetric precautions including but not limited to vaginal bleeding, contractions, leaking of fluid and fetal movement were reviewed in detail with the patient. Please refer to After Visit Summary for other counseling recommendations.  Return in about 2 weeks (around 11/27/2019) for OB visit, virtual.   Hermina Staggers, MD

## 2019-11-14 LAB — CBC
Hematocrit: 35.4 % (ref 34.0–46.6)
Hemoglobin: 12.5 g/dL (ref 11.1–15.9)
MCH: 32.3 pg (ref 26.6–33.0)
MCHC: 35.3 g/dL (ref 31.5–35.7)
MCV: 92 fL (ref 79–97)
Platelets: 191 10*3/uL (ref 150–450)
RBC: 3.87 x10E6/uL (ref 3.77–5.28)
RDW: 13.1 % (ref 11.7–15.4)
WBC: 6.7 10*3/uL (ref 3.4–10.8)

## 2019-11-14 LAB — HIV ANTIBODY (ROUTINE TESTING W REFLEX): HIV Screen 4th Generation wRfx: NONREACTIVE

## 2019-11-14 LAB — GLUCOSE TOLERANCE, 2 HOURS W/ 1HR
Glucose, 1 hour: 137 mg/dL (ref 65–179)
Glucose, 2 hour: 108 mg/dL (ref 65–152)
Glucose, Fasting: 78 mg/dL (ref 65–91)

## 2019-11-14 LAB — RPR: RPR Ser Ql: NONREACTIVE

## 2019-11-18 ENCOUNTER — Other Ambulatory Visit: Payer: Self-pay

## 2019-11-18 ENCOUNTER — Ambulatory Visit (HOSPITAL_COMMUNITY)
Admission: RE | Admit: 2019-11-18 | Discharge: 2019-11-18 | Disposition: A | Discharge status: Home / Self Care | Payer: Medicaid Other | Source: Ambulatory Visit | Attending: Obstetrics | Admitting: Obstetrics

## 2019-11-18 ENCOUNTER — Encounter (HOSPITAL_COMMUNITY): Payer: Self-pay

## 2019-11-18 ENCOUNTER — Ambulatory Visit (HOSPITAL_COMMUNITY): Payer: Medicaid Other | Admitting: *Deleted

## 2019-11-18 DIAGNOSIS — O4440 Low lying placenta NOS or without hemorrhage, unspecified trimester: Secondary | ICD-10-CM

## 2019-11-18 DIAGNOSIS — O4443 Low lying placenta NOS or without hemorrhage, third trimester: Secondary | ICD-10-CM | POA: Insufficient documentation

## 2019-11-18 DIAGNOSIS — O4403 Placenta previa specified as without hemorrhage, third trimester: Secondary | ICD-10-CM | POA: Diagnosis not present

## 2019-11-18 DIAGNOSIS — O99013 Anemia complicating pregnancy, third trimester: Secondary | ICD-10-CM | POA: Diagnosis not present

## 2019-11-18 DIAGNOSIS — O4402 Placenta previa specified as without hemorrhage, second trimester: Secondary | ICD-10-CM

## 2019-11-18 DIAGNOSIS — Z3A29 29 weeks gestation of pregnancy: Secondary | ICD-10-CM | POA: Insufficient documentation

## 2019-11-18 DIAGNOSIS — E669 Obesity, unspecified: Secondary | ICD-10-CM | POA: Insufficient documentation

## 2019-11-18 DIAGNOSIS — O34219 Maternal care for unspecified type scar from previous cesarean delivery: Secondary | ICD-10-CM

## 2019-11-18 DIAGNOSIS — Z362 Encounter for other antenatal screening follow-up: Secondary | ICD-10-CM

## 2019-11-18 DIAGNOSIS — O99213 Obesity complicating pregnancy, third trimester: Secondary | ICD-10-CM | POA: Diagnosis not present

## 2019-11-18 DIAGNOSIS — D573 Sickle-cell trait: Secondary | ICD-10-CM | POA: Insufficient documentation

## 2019-11-18 DIAGNOSIS — O09213 Supervision of pregnancy with history of pre-term labor, third trimester: Secondary | ICD-10-CM | POA: Diagnosis not present

## 2019-11-19 ENCOUNTER — Telehealth: Payer: Self-pay

## 2019-11-19 NOTE — Telephone Encounter (Signed)
Returned call and answered questions about makena, pt states she will pick up at her next appt to do injections at home.

## 2019-11-27 ENCOUNTER — Encounter: Payer: Self-pay | Admitting: Family Medicine

## 2019-11-27 ENCOUNTER — Telehealth (INDEPENDENT_AMBULATORY_CARE_PROVIDER_SITE_OTHER): Payer: Medicaid Other | Admitting: Family Medicine

## 2019-11-27 VITALS — BP 123/80 | HR 90

## 2019-11-27 DIAGNOSIS — O99333 Smoking (tobacco) complicating pregnancy, third trimester: Secondary | ICD-10-CM

## 2019-11-27 DIAGNOSIS — O4403 Placenta previa specified as without hemorrhage, third trimester: Secondary | ICD-10-CM

## 2019-11-27 DIAGNOSIS — O09213 Supervision of pregnancy with history of pre-term labor, third trimester: Secondary | ICD-10-CM

## 2019-11-27 DIAGNOSIS — K219 Gastro-esophageal reflux disease without esophagitis: Secondary | ICD-10-CM

## 2019-11-27 DIAGNOSIS — O34219 Maternal care for unspecified type scar from previous cesarean delivery: Secondary | ICD-10-CM

## 2019-11-27 DIAGNOSIS — O4402 Placenta previa specified as without hemorrhage, second trimester: Secondary | ICD-10-CM

## 2019-11-27 DIAGNOSIS — O99613 Diseases of the digestive system complicating pregnancy, third trimester: Secondary | ICD-10-CM

## 2019-11-27 DIAGNOSIS — O09293 Supervision of pregnancy with other poor reproductive or obstetric history, third trimester: Secondary | ICD-10-CM

## 2019-11-27 DIAGNOSIS — Z3A30 30 weeks gestation of pregnancy: Secondary | ICD-10-CM | POA: Diagnosis not present

## 2019-11-27 DIAGNOSIS — O0990 Supervision of high risk pregnancy, unspecified, unspecified trimester: Secondary | ICD-10-CM

## 2019-11-27 MED ORDER — PANTOPRAZOLE SODIUM 20 MG PO TBEC: 20.0000 mg | DELAYED_RELEASE_TABLET | Freq: Every day | ORAL | 1 refills | Status: DC

## 2019-11-27 NOTE — Patient Instructions (Signed)

## 2019-11-27 NOTE — Progress Notes (Signed)
Virtual Visit via Telephone Note  I connected with [name] on 11/27/19 at 10:45 AM EDT by telephone and verified that I am speaking with the correct person using two identifiers.  Pt not home; unable to check BP.  Pt coming today to pick up Makena injections to self administer at home. We will check BP once she gets here.

## 2019-11-28 ENCOUNTER — Encounter: Payer: Self-pay | Admitting: Family Medicine

## 2019-11-28 NOTE — Progress Notes (Signed)
OBSTETRICS PRENATAL VIRTUAL VISIT ENCOUNTER NOTE  Provider location: [facility] for [facility] [place]   I connected with TOMASINA KEASLING on 11/28/19 at 10:45 AM EDT by MyChart Video Encounter at home and verified that I am speaking with the correct person using two identifiers.   I discussed the limitations, risks, security and privacy concerns of performing an evaluation and management service virtually and the availability of in person appointments. I also discussed with the patient that there may be a patient responsible charge related to this service. The patient expressed understanding and agreed to proceed. Subjective:  [name] is a 34 y.o. [id number] at [redacted]w[redacted]d being seen today for ongoing prenatal care.  She is currently monitored for the following issues for this low-risk pregnancy and has Perennial and seasonal allergic rhinitis; Seasonal allergic conjunctivitis; Tobacco use; Supervision of high risk pregnancy, antepartum; Hx of cesarean section complicating pregnancy; History of preterm delivery; Placenta previa antepartum in second trimester; Unwanted fertility; and Hemorrhoids on their problem list.  Patient reports no complaints.  Contractions: Not present. Vag. Bleeding: None.  Movement: Present. Denies any leaking of fluid.   The following portions of the patient's history were reviewed and updated as appropriate: allergies, current medications, past family history, past medical history, past social history, past surgical history and problem list.   Objective:   Vitals:   11/27/19 1038  BP: 123/80  Pulse: 90    Fetal Status:     Movement: Present     General:  Alert, oriented and cooperative. Patient is in no acute distress.  Respiratory: Normal respiratory effort, no problems with respiration noted  Mental Status: Normal mood and affect. Normal behavior. Normal judgment and thought content.  Rest of physical exam deferred due to type of  encounter  Imaging: [place] MFM OB FOLLOW UP  Result Date: 11/18/2019 ----------------------------------------------------------------------  OBSTETRICS REPORT                       (Signed Final 11/18/2019 02:06 pm) ---------------------------------------------------------------------- Patient Info  ID #:       [id number]                          D.O.B.:  [birth year] (33 yrs)  Name:       [name]            Visit Date: 11/18/2019 01:45 pm ---------------------------------------------------------------------- Performed By  Performed By:     Lenise Arena        Ref. Address:     Faculty                    RDMS  Attending:        Ma Rings MD         [facility]  Referred By:      Sharyon Cable CNM ---------------------------------------------------------------------- Orders   #  Description  Code         Ordered By   1  [place] MFM OB FOLLOW UP                  [id number]     Rosana Hoes  ----------------------------------------------------------------------   #  Order #                    Accession #                 Episode #   1  [id number]                  [id number]                  [id number]  ---------------------------------------------------------------------- Indications   Placenta previa specified as without           O44.03   hemorrhage, third trimester   Obesity complicating pregnancy, second         O99.212   trimester (Pre Pregnancy BMI 30.9)   Poor obstetric history: Previous preterm       O09.219   delivery, antepartum (@ 28wks)   History of cesarean delivery, currently        O34.219   pregnant   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 Cord Insertion:      Previously Visualized  Amniotic Fluid  AFI FV:      Within normal limits  AFI Sum(cm)     %Tile       Largest Pocket(cm)  17.06           63          4.83  RUQ(cm)       RLQ(cm)       LUQ(cm)        LLQ(cm)  4.34          4.83          3.48           4.41  Comment:    Low Lying Placenta RESOLVED ---------------------------------------------------------------------- Biometry  BPD:      69.7  mm     G. Age:  28w 0d         12  %    CI:        68.36   %    70 - 86                                                          FL/HC:      20.1   %  19.6 - 20.8  HC:      269.5  mm     G. Age:  29w 3d         28  %    HC/AC:      1.04        0.99 - 1.21  AC:      257.9  mm     G. Age:  30w 0d         72  %    FL/BPD:     77.9   %    71 - 87  FL:       54.3  mm     G. Age:  28w 5d         27  %    FL/AC:      21.1   %    20 - 24  Est. FW:    1379  gm      3 lb 1 oz     50  % ---------------------------------------------------------------------- OB History  Gravidity:    7         Term:   1        Prem:   1        SAB:   1  TOP:          3        Living:  1 ---------------------------------------------------------------------- Gestational Age  LMP:           28w 0d        Date:  05/06/19                 EDD:   02/10/20  U/S Today:     29w 0d                                        EDD:   02/03/20  Best:          29w 0d     Det. By:  Marcella DubsEarly Ultrasound         EDD:   02/03/20                                      (06/25/19) ---------------------------------------------------------------------- Anatomy  Cranium:               Appears normal         Aortic Arch:            Previously seen  Cavum:                 Appears normal         Ductal  Arch:            Previously seen  Ventricles:            Appears normal         Diaphragm:              Appears normal  Choroid Plexus:        Previously seen        Stomach:                Appears normal, left  sided  Cerebellum:            Previously seen        Abdomen:                Appears normal  Posterior Fossa:       Previously seen        Abdominal Wall:         Previously seen  Nuchal Fold:           Previously seen        Cord Vessels:           Previously seen  Face:                  Orbits and profile     Kidneys:                Appear normal                         previously seen  Lips:                  Previously seen        Bladder:                Appears normal  Thoracic:              Appears normal         Spine:                  Previously seen  Heart:                 Appears normal         Upper Extremities:      Previously seen                         (4CH, axis, and                         situs)  RVOT:                  Previously seen        Lower Extremities:      Previously seen  LVOT:                  Previously seen  Other:  Fetus appears to be a female. Heels,nasal bone previously visualized.          Technically difficult due to fetal movement. Hands are well visualized          today. ---------------------------------------------------------------------- Cervix Uterus Adnexa  Cervix  Length:           4.32  cm.  Normal appearance by transabdominal scan. ---------------------------------------------------------------------- Comments  The patient has been followed for placenta previa. She  denies any problems since her last visit.  The fetal growth and amniotic fluid level were appropriate for  her gestational age  The placenta previa has resolved on today's exam.  The patient already has a repeat cesarean delivery  scheduled at around 39 weeks.  Follow-up as indicated.  ----------------------------------------------------------------------                   Ma Rings, MD Electronically Signed Final Report   11/18/2019 02:06 pm ----------------------------------------------------------------------   Assessment and Plan:  Pregnancy: [id number] at [redacted]w[redacted]d 1. Placenta previa antepartum in second trimester Resolved  2.  Hx of cesarean section complicating pregnancy Desires RCS and BTL--booked today  3. Supervision of high risk pregnancy, antepartum   4. Gastroesophageal reflux disease without esophagitis Trying TUMS, which is not working, will move to PPI. May take qod or q 3 days - pantoprazole (PROTONIX) 20 MG tablet; Take 1 tablet (20 mg total) by mouth daily.  Dispense: 30 tablet; Refill: 1  Preterm labor symptoms and general obstetric precautions including but not limited to vaginal bleeding, contractions, leaking of fluid and fetal movement were reviewed in detail with the patient. I discussed the assessment and treatment plan with the patient. The patient was provided an opportunity to ask questions and all were answered. The patient agreed with the plan and demonstrated an understanding of the instructions. The patient was advised to call back or seek an in-person office evaluation/go to [facility] at [facility] for any urgent or concerning symptoms. Please refer to After Visit Summary for other counseling recommendations.   I provided 11 minutes of face-to-face time during this encounter.  Return in 2 weeks (on 12/11/2019).  Future Appointments  Date Time Provider [place]  12/11/2019 11:15 AM Constant, Vickii Chafe, MD [place] None    Donnamae Jude, MD [facility] for [facility], [place]

## 2019-12-11 ENCOUNTER — Encounter: Payer: Medicaid Other | Admitting: Obstetrics and Gynecology

## 2019-12-15 ENCOUNTER — Other Ambulatory Visit: Payer: Self-pay | Admitting: Family Medicine

## 2019-12-24 ENCOUNTER — Ambulatory Visit (INDEPENDENT_AMBULATORY_CARE_PROVIDER_SITE_OTHER): Payer: Medicaid Other

## 2019-12-24 ENCOUNTER — Other Ambulatory Visit: Payer: Self-pay

## 2019-12-24 DIAGNOSIS — Z5189 Encounter for other specified aftercare: Secondary | ICD-10-CM | POA: Diagnosis not present

## 2019-12-24 NOTE — Progress Notes (Signed)
[name] is here for incision check.  Wound closed and and no drainage noted. Pt advised to keep PP visit on the 29th. -EH/RMA

## 2019-12-25 ENCOUNTER — Ambulatory Visit: Payer: Medicaid Other | Attending: Internal Medicine

## 2019-12-25 DIAGNOSIS — Z23 Encounter for immunization: Secondary | ICD-10-CM

## 2019-12-25 NOTE — Progress Notes (Signed)
   Covid-19 Vaccination Clinic  Name:  [name]    MRN: [id number] DOB: 23-Oct-1985  12/25/2019  [name] was observed post Covid-19 immunization for 15 minutes without incident. She was provided with Vaccine Information Sheet and instruction to access the V-Safe system.   [name] was instructed to call 911 with any severe reactions post vaccine: Marland Kitchen Difficulty breathing  . Swelling of face and throat  . A fast heartbeat  . A bad rash all over body  . Dizziness and weakness   Immunizations Administered    Name Date Dose VIS Date Route   Pfizer COVID-19 Vaccine 12/25/2019  8:23 AM 0.3 mL 08/22/2019 Intramuscular   Manufacturer: [employer], [employer]   Lot: [id number]   NDC: [address]-5

## 2019-12-25 NOTE — Progress Notes (Signed)
Patient ID: [name], female   DOB: Jun 11, 1986, 34 y.o.   MRN: [id number] Patient seen and assessed by nursing staff during this encounter. I have reviewed the chart and agree with the documentation and plan. I have also made any necessary editorial changes.  Scheryl Darter, MD 12/25/2019 9:23 AM

## 2020-01-08 ENCOUNTER — Other Ambulatory Visit: Payer: Self-pay

## 2020-01-08 ENCOUNTER — Encounter: Payer: Self-pay | Admitting: Obstetrics and Gynecology

## 2020-01-08 ENCOUNTER — Ambulatory Visit (INDEPENDENT_AMBULATORY_CARE_PROVIDER_SITE_OTHER): Payer: Medicaid Other | Admitting: Obstetrics and Gynecology

## 2020-01-08 NOTE — Patient Instructions (Signed)
Health Maintenance, Female Adopting a healthy lifestyle and getting preventive care are important in promoting health and wellness. Ask your health care provider about:  The right schedule for you to have regular tests and exams.  Things you can do on your own to prevent diseases and keep yourself healthy. What should I know about diet, weight, and exercise? Eat a healthy diet   Eat a diet that includes plenty of vegetables, fruits, low-fat dairy products, and lean protein.  Do not eat a lot of foods that are high in solid fats, added sugars, or sodium. Maintain a healthy weight Body mass index (BMI) is used to identify weight problems. It estimates body fat based on height and weight. Your health care provider can help determine your BMI and help you achieve or maintain a healthy weight. Get regular exercise Get regular exercise. This is one of the most important things you can do for your health. Most adults should:  Exercise for at least 150 minutes each week. The exercise should increase your heart rate and make you sweat (moderate-intensity exercise).  Do strengthening exercises at least twice a week. This is in addition to the moderate-intensity exercise.  Spend less time sitting. Even light physical activity can be beneficial. Watch cholesterol and blood lipids Have your blood tested for lipids and cholesterol at 33 years of age, then have this test every 5 years. Have your cholesterol levels checked more often if:  Your lipid or cholesterol levels are high.  You are older than 33 years of age.  You are at high risk for heart disease. What should I know about cancer screening? Depending on your health history and family history, you may need to have cancer screening at various ages. This may include screening for:  Breast cancer.  Cervical cancer.  Colorectal cancer.  Skin cancer.  Lung cancer. What should I know about heart disease, diabetes, and high blood  pressure? Blood pressure and heart disease  High blood pressure causes heart disease and increases the risk of stroke. This is more likely to develop in people who have high blood pressure readings, are of African descent, or are overweight.  Have your blood pressure checked: ? Every 3-5 years if you are 18-39 years of age. ? Every year if you are 40 years old or older. Diabetes Have regular diabetes screenings. This checks your fasting blood sugar level. Have the screening done:  Once every three years after age 40 if you are at a normal weight and have a low risk for diabetes.  More often and at a younger age if you are overweight or have a high risk for diabetes. What should I know about preventing infection? Hepatitis B If you have a higher risk for hepatitis B, you should be screened for this virus. Talk with your health care provider to find out if you are at risk for hepatitis B infection. Hepatitis C Testing is recommended for:  Everyone born from 1945 through 1965.  Anyone with known risk factors for hepatitis C. Sexually transmitted infections (STIs)  Get screened for STIs, including gonorrhea and chlamydia, if: ? You are sexually active and are younger than 33 years of age. ? You are older than 33 years of age and your health care provider tells you that you are at risk for this type of infection. ? Your sexual activity has changed since you were last screened, and you are at increased risk for chlamydia or gonorrhea. Ask your health care provider if   you are at risk.  Ask your health care provider about whether you are at high risk for HIV. Your health care provider may recommend a prescription medicine to help prevent HIV infection. If you choose to take medicine to prevent HIV, you should first get tested for HIV. You should then be tested every 3 months for as long as you are taking the medicine. Pregnancy  If you are about to stop having your period (premenopausal) and  you may become pregnant, seek counseling before you get pregnant.  Take 400 to 800 micrograms (mcg) of folic acid every day if you become pregnant.  Ask for birth control (contraception) if you want to prevent pregnancy. Osteoporosis and menopause Osteoporosis is a disease in which the bones lose minerals and strength with aging. This can result in bone fractures. If you are 65 years old or older, or if you are at risk for osteoporosis and fractures, ask your health care provider if you should:  Be screened for bone loss.  Take a calcium or vitamin D supplement to lower your risk of fractures.  Be given hormone replacement therapy (HRT) to treat symptoms of menopause. Follow these instructions at home: Lifestyle  Do not use any products that contain nicotine or tobacco, such as cigarettes, e-cigarettes, and chewing tobacco. If you need help quitting, ask your health care provider.  Do not use street drugs.  Do not share needles.  Ask your health care provider for help if you need support or information about quitting drugs. Alcohol use  Do not drink alcohol if: ? Your health care provider tells you not to drink. ? You are pregnant, may be pregnant, or are planning to become pregnant.  If you drink alcohol: ? Limit how much you use to 0-1 drink a day. ? Limit intake if you are breastfeeding.  Be aware of how much alcohol is in your drink. In the [place] one drink equals one 12 oz bottle of beer (355 mL), one 5 oz glass of wine (148 mL), or one 1 oz glass of hard liquor (44 mL). General instructions  Schedule regular health, dental, and eye exams.  Stay current with your vaccines.  Tell your health care provider if: ? You often feel depressed. ? You have ever been abused or do not feel safe at home. Summary  Adopting a healthy lifestyle and getting preventive care are important in promoting health and wellness.  Follow your health care provider's instructions about healthy  diet, exercising, and getting tested or screened for diseases.  Follow your health care provider's instructions on monitoring your cholesterol and blood pressure. This information is not intended to replace advice given to you by your health care provider. Make sure you discuss any questions you have with your health care provider. Document Revised: 08/21/2018 Document Reviewed: 08/21/2018 Elsevier Patient Education  2020 Elsevier Inc.  

## 2020-01-08 NOTE — Progress Notes (Signed)
    Post Partum Visit Note  [name] is a 34 y.o. [phone] female who presents for a postpartum visit. She is 4 weeks postpartum following a tubal ligation was performed.  I have fully reviewed the prenatal and intrapartum course. The delivery was at [redacted]w[redacted]d gestational weeks.  Anesthesia: spinal. Postpartum course has been unremarkable. Baby is doing well. Baby is feeding by bottle - Similac Neosure. Bleeding spotting. Bowel function is normal. Bladder function is normal. Patient is not sexually active. Contraception method is tubal ligation. Postpartum depression screening: negative.  The following portions of the patient's history were reviewed and updated as appropriate: allergies, current medications, past family history, past medical history, past social history, past surgical history and problem list.  Review of Systems Pertinent items noted in HPI and remainder of comprehensive ROS otherwise negative.    Objective:  Last menstrual period 05/06/2019, unknown if currently breastfeeding.  General:  alert   Breasts:  not evaluated  Lungs: clear to auscultation bilaterally  Heart:  regular rate and rhythm, S1, S2 normal, no murmur, click, rub or gallop  Abdomen: soft, non-tender; bowel sounds normal; no masses,  no organomegaly, incision well healed   Vulva:  not evaluated  Vagina: not evaluated  Cervix:  not evaluated  Corpus: not examined  Adnexa:  not evaluated  Rectal Exam: Not performed.        Assessment:    Nl  postpartum exam. Pap smear not done at today's visit.   Plan:   Essential components of care per ACOG recommendations:  1.  Mood and well being: Patient with negative depression screening today. Reviewed local resources for support.  - Patient does not use tobacco.  - hx of drug use? No    2. Infant care and feeding:  -Patient currently breastmilk feeding? No I-Social determinants of health (SDOH) reviewed in EPIC. No concerns  3. Sexuality,  contraception , S/P BTL 4. Sleep and fatigue -Encouraged family/partner/community support of 4 hrs of uninterrupted sleep to help with mood and fatigue  5. Physical Recovery  - 6.  Health Maintenance - Last pap smear done 08/26/2019 and was abnormal HPV positive.   Dalphine Handing, CMA [facility] for [employer], [facility]

## 2020-01-19 ENCOUNTER — Ambulatory Visit: Payer: Medicaid Other | Attending: Internal Medicine

## 2020-01-19 DIAGNOSIS — Z23 Encounter for immunization: Secondary | ICD-10-CM

## 2020-01-19 NOTE — Progress Notes (Signed)
   Covid-19 Vaccination Clinic  Name:  [name]    MRN: [id number] DOB: Sep 15, 1985  01/19/2020  [name] was observed post Covid-19 immunization for 15 minutes without incident. She was provided with Vaccine Information Sheet and instruction to access the V-Safe system.   [name] was instructed to call 911 with any severe reactions post vaccine: Marland Kitchen Difficulty breathing  . Swelling of face and throat  . A fast heartbeat  . A bad rash all over body  . Dizziness and weakness   Immunizations Administered    Name Date Dose VIS Date Route   Pfizer COVID-19 Vaccine 01/19/2020  1:14 PM 0.3 mL 11/05/2018 Intramuscular   Manufacturer: [employer], [employer]   Lot: [id number]   NDC: [address]-3

## 2020-01-21 ENCOUNTER — Ambulatory Visit: Payer: Medicaid Other

## 2020-01-25 ENCOUNTER — Other Ambulatory Visit (HOSPITAL_COMMUNITY): Admission: RE | Admit: 2020-01-25 | Payer: Medicaid Other | Source: Ambulatory Visit

## 2020-01-27 ENCOUNTER — Encounter (HOSPITAL_COMMUNITY): Admission: RE | Payer: Self-pay | Source: Home / Self Care

## 2020-01-27 ENCOUNTER — Inpatient Hospital Stay (HOSPITAL_COMMUNITY): Admission: RE | Admit: 2020-01-27 | Payer: Medicaid Other | Source: Home / Self Care | Admitting: Family Medicine

## 2020-01-27 SURGERY — Surgical Case
Anesthesia: Regional

## 2021-08-30 IMAGING — US US OB < 14 WEEKS - US OB TV
1 series · 15 of 28 positions shown · non-contrast
Comparison: No prior scans from this gestation.

CLINICAL DATA: 33-year-old pregnant female presents with vaginal
bleeding. Quantitative beta HCG [DATE].

EDC by LMP: 02/10/2020, projecting to an expected gestational age of
7 weeks 1 day.
EXAM:
OBSTETRIC <14 WK US AND TRANSVAGINAL OB US
TECHNIQUE: Both transabdominal and transvaginal ultrasound examinations were
performed for complete evaluation of the gestation as well as the
maternal uterus, adnexal regions, and pelvic cul-de-sac.
Transvaginal technique was performed to assess early pregnancy.

[Series 1: us ob < 14 weeks - us ob tv · 15 of 75 slices shown]
[im 1/75]
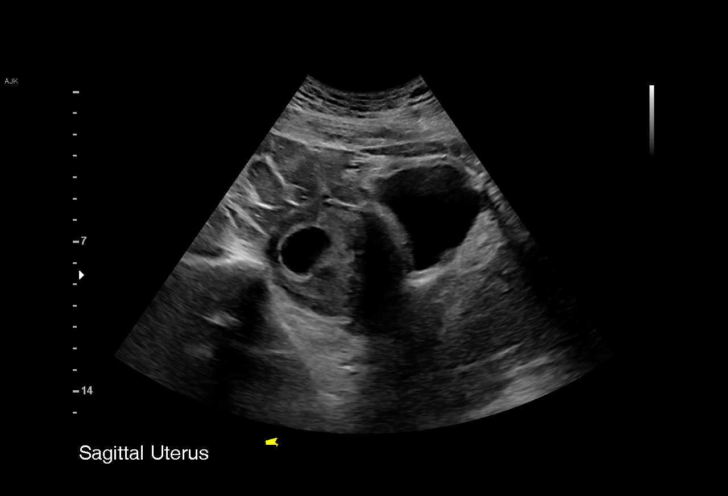
[im 6/75]
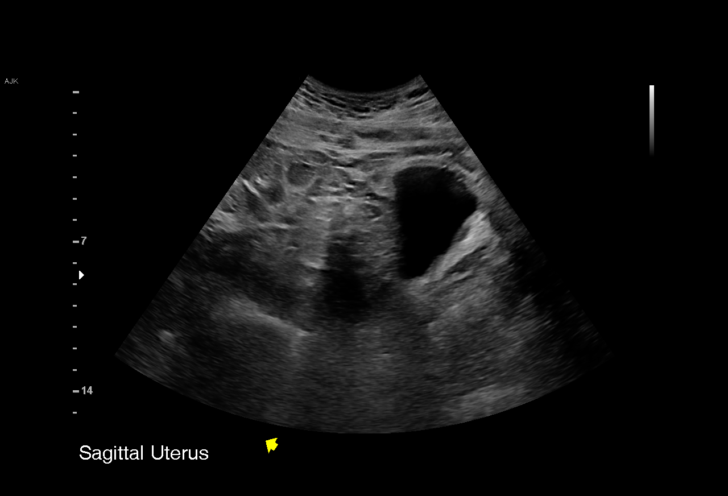
[im 11/75]
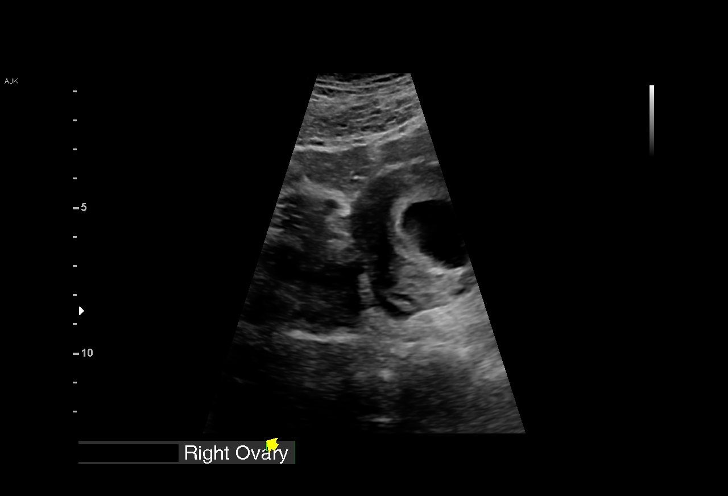
[im 17/75]
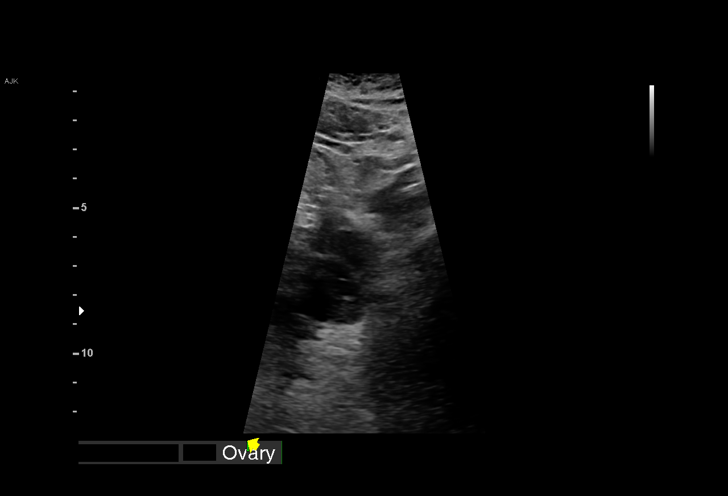
[im 22/75]
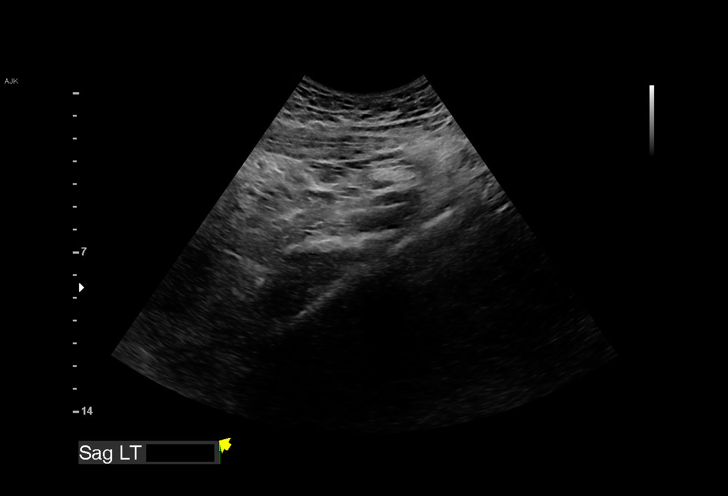
[im 28/75]
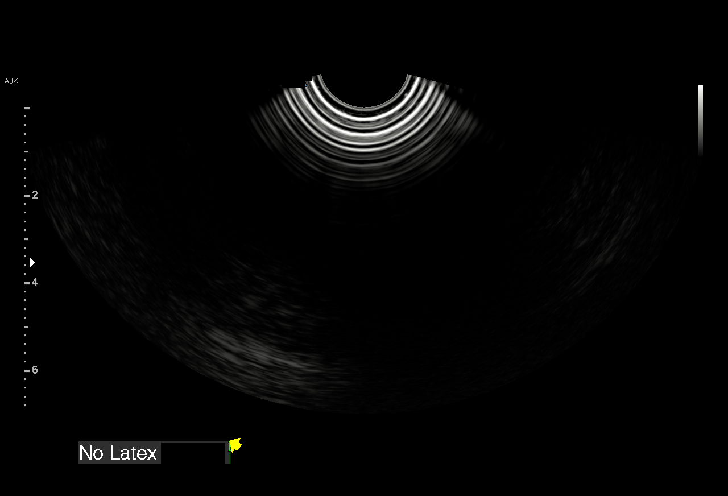
[im 33/75]
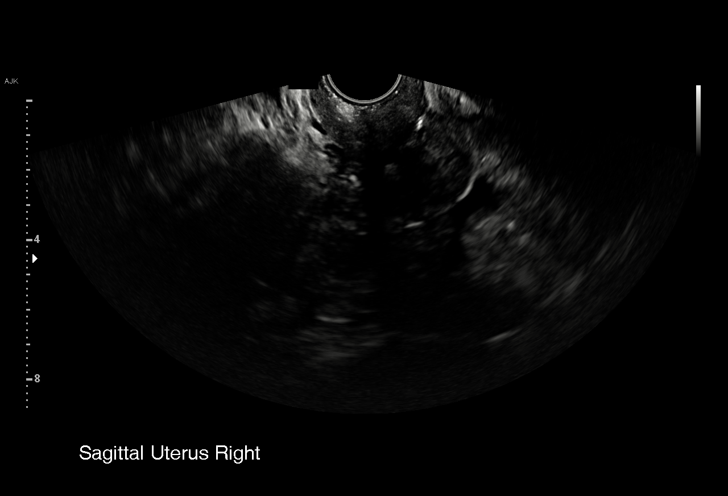
[im 39/75]
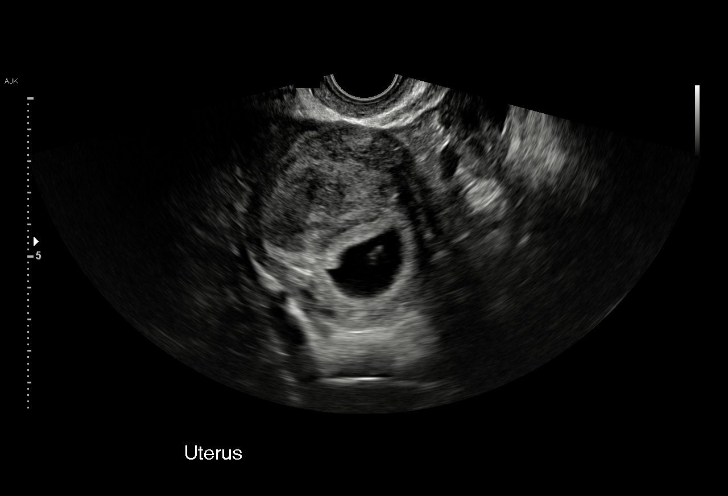
[im 42/75]
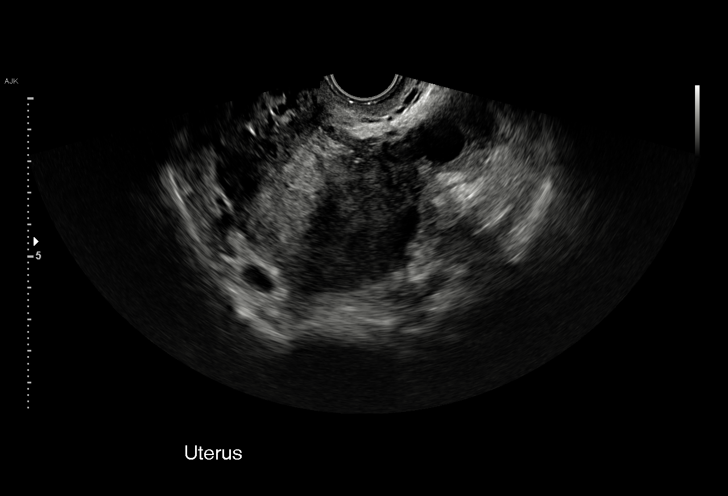
[im 47/75]
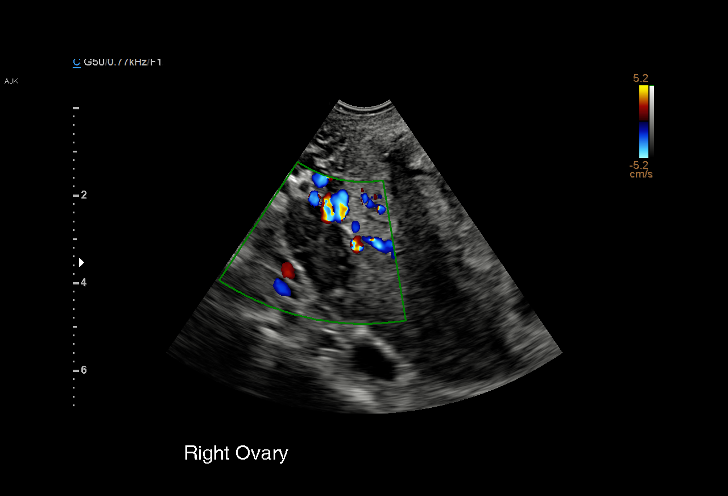
[im 53/75]
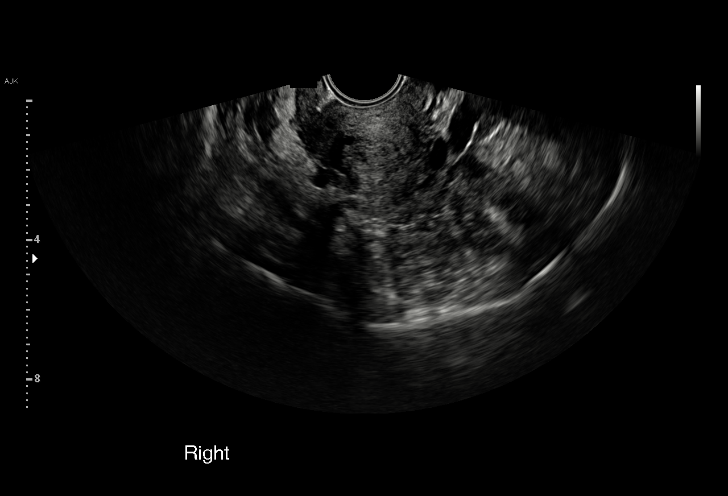
[im 58/75]
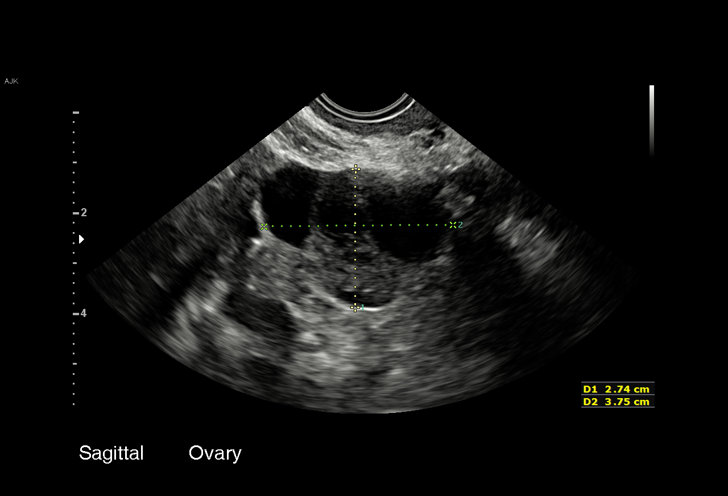
[im 64/75]
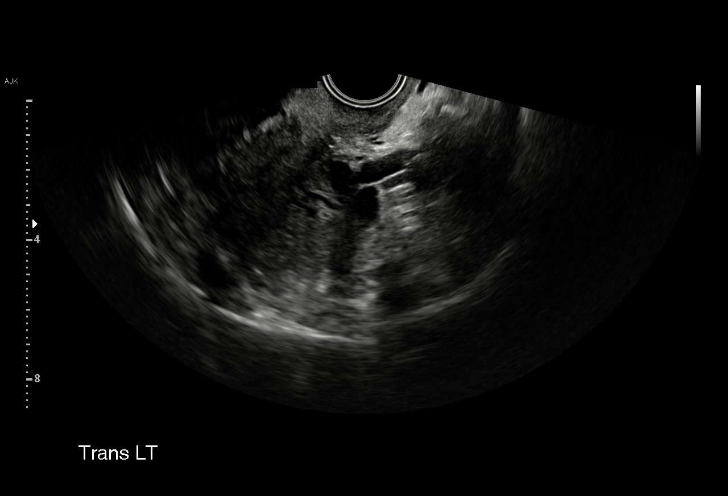
[im 69/75]
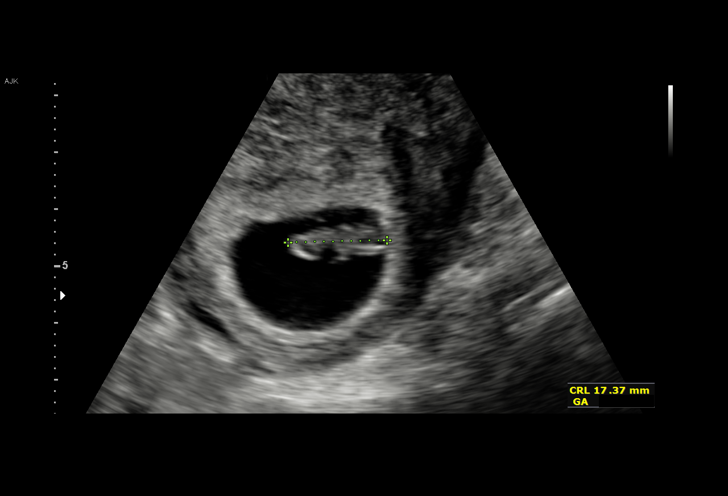
[im 75/75]
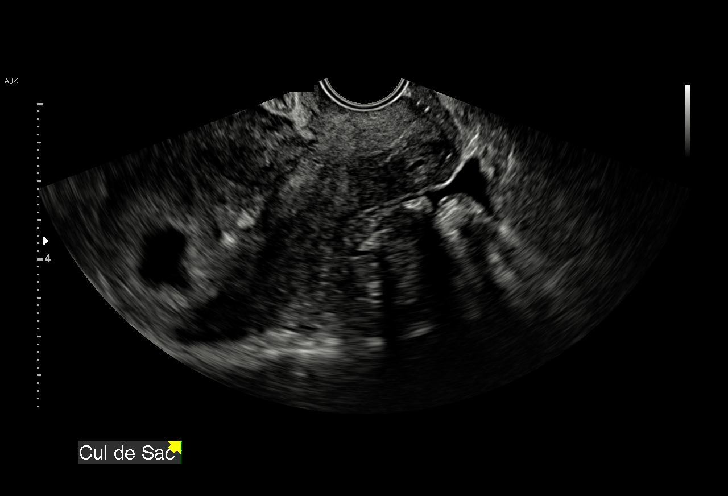

[15 of 28 positions shown; findings below may reference images not displayed]

FINDINGS: Intrauterine gestational sac: Single

Yolk sac:  Visualized.

Embryo:  Visualized.

Cardiac Activity: Visualized.

Heart Rate: 157 bpm

CRL:  17.6 mm   8 w   1 d                  US EDC: 02/03/2020

Subchorionic hemorrhage:  None visualized.

Maternal uterus/adnexae: Anteverted uterus with no fibroids
demonstrated. Trace simple free fluid in the pelvic cul-de-sac.
Right ovary measures 2.8 x 1.6 x 1.6 cm. Left ovary measures 3.8 x
2.7 x 2.8 cm and contains a corpus luteum. No abnormal ovarian or
adnexal masses.
IMPRESSION: 1. Single living intrauterine gestation at 8 weeks 1 day by
crown-rump length, without significant discrepancy with the provided
menstrual dating.
2. No acute first-trimester gestational abnormality.

## 2021-11-14 IMAGING — US US MFM OB DETAIL+14 WK
1 series · 13 of 28 positions shown · non-contrast
Comparison: none

[Series 1: us mfm ob detail+14 wk · 111 acquisitions, 13 frames shown]
[im 5/111]
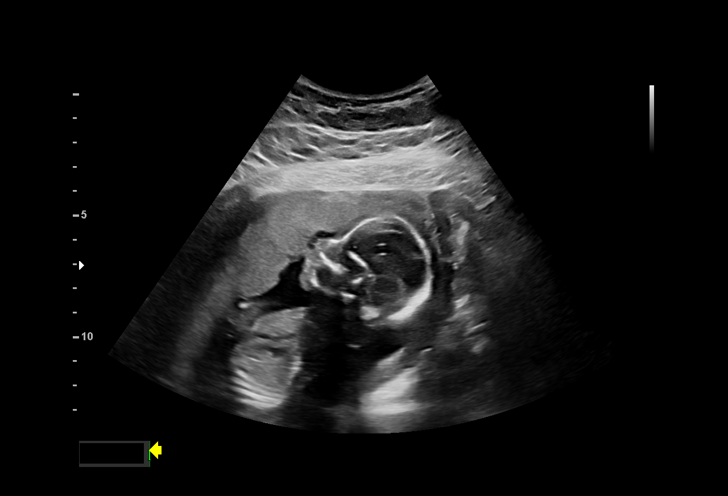
[im 13/111]
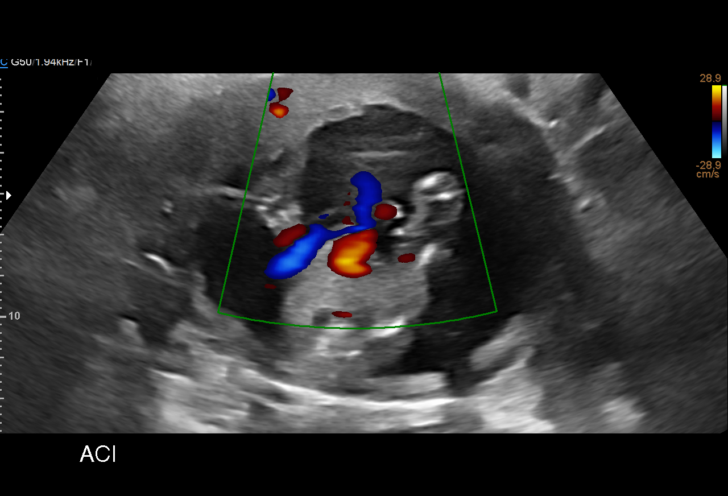
[im 21/111]
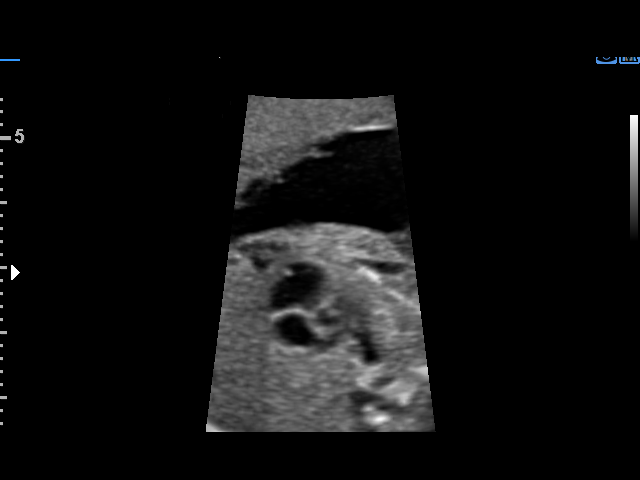
[im 29/111]
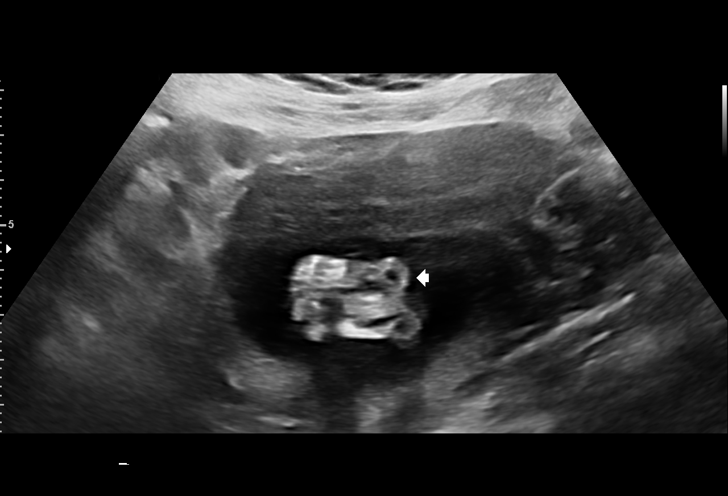
[im 37/111]
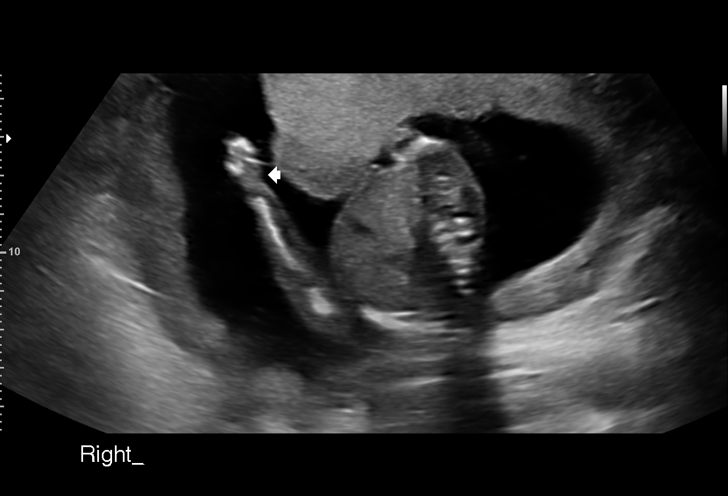
[im 45/111]
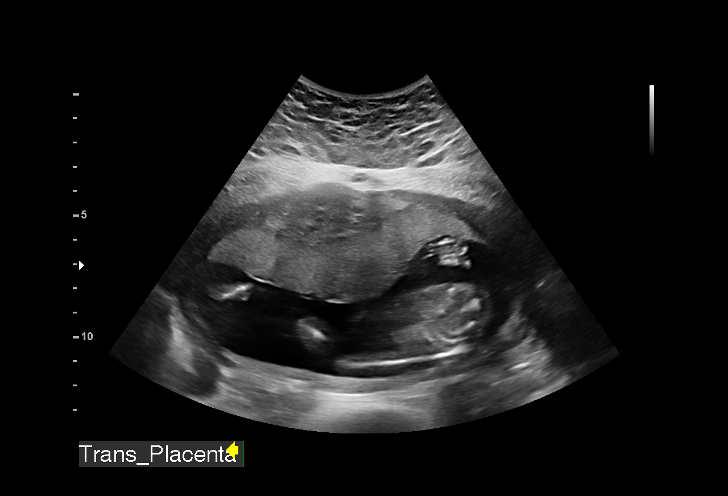
[im 58/111]
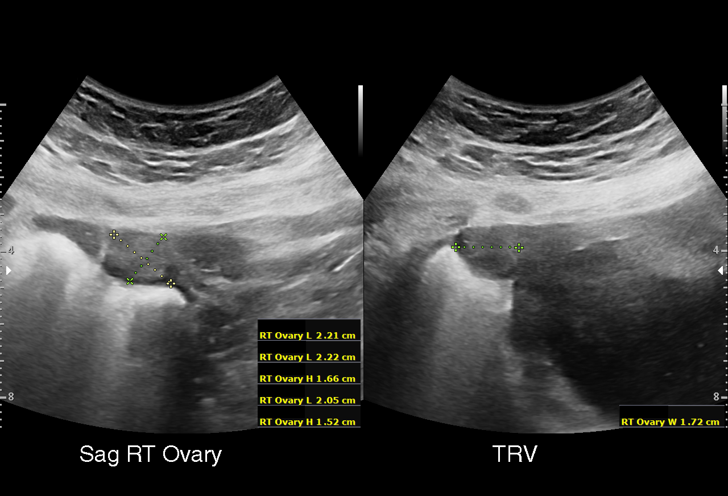
[im 66/111]
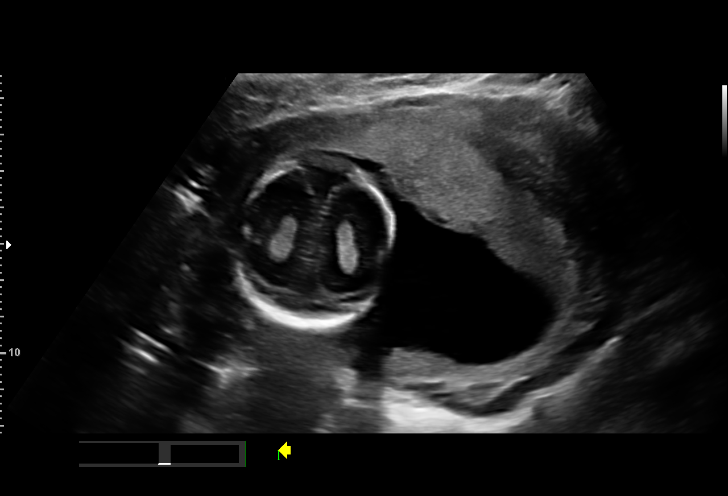
[im 74/111]
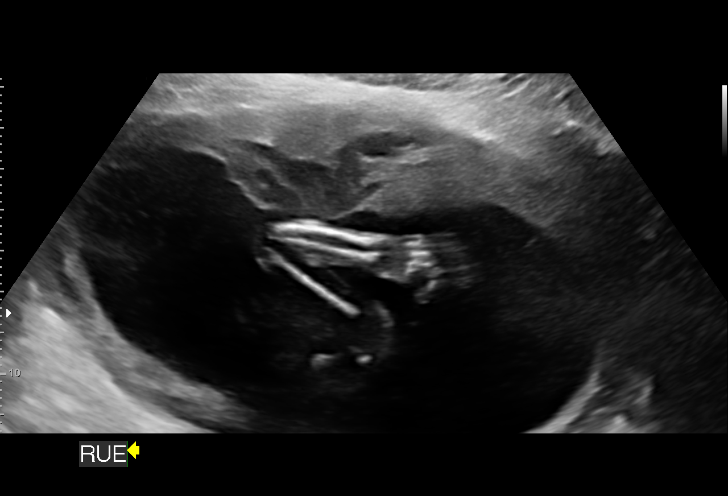
[im 82/111]
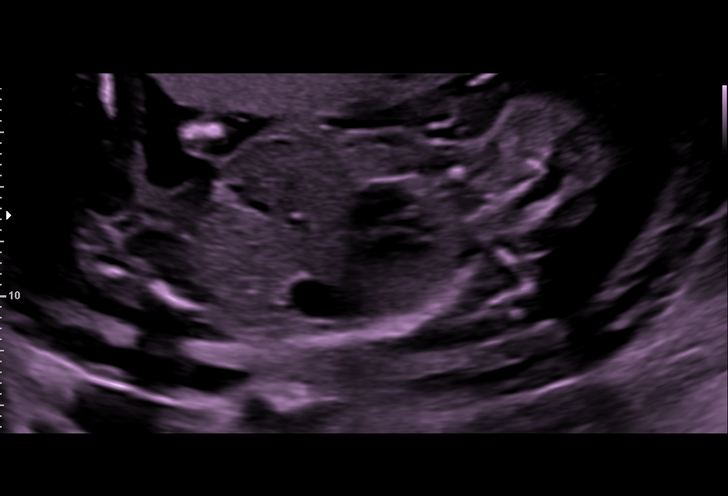
[im 90/111]
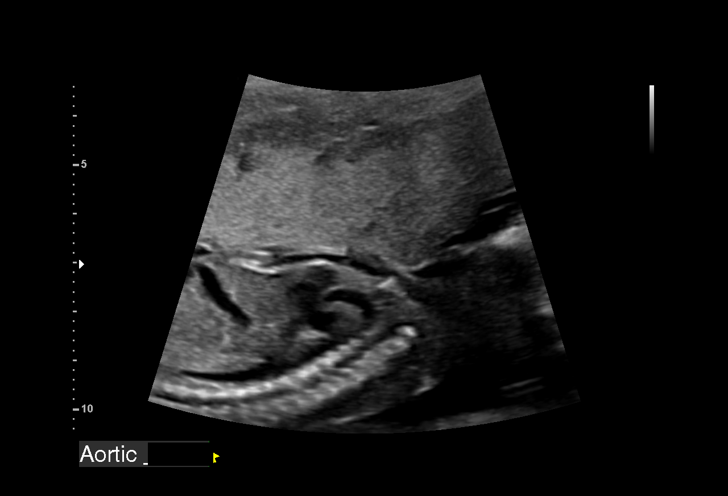
[im 98/111]
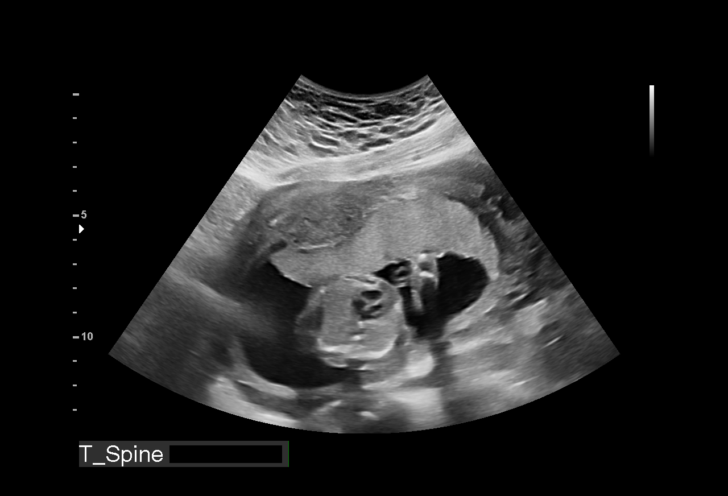
[im 106/111]
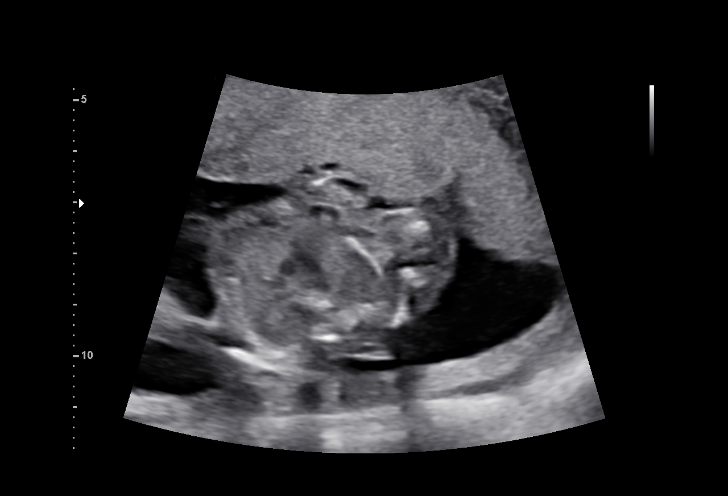

[13 of 28 positions shown; findings below may reference images not displayed]

JRN CNM

 ----------------------------------------------------------------------

 ----------------------------------------------------------------------
Indications

  Obesity complicating pregnancy, second
  trimester (Pre Pregnancy BMI 30.9)
  Poor obstetric history: Previous preterm
  delivery, antepartum (@ 40wks)
  Encounter for antenatal screening for
  malformations
  19 weeks gestation of pregnancy
  History of cesarean delivery, currently
  pregnant
  History of sickle cell trait (carrier for sickle
  cell disease)
  Low risk NIPS
 ----------------------------------------------------------------------
Fetal Evaluation

 Num Of Fetuses:         1
 Fetal Heart Rate(bpm):  134
 Cardiac Activity:       Observed
 Presentation:           Transverse, head to maternal left
 Placenta:               Anterior previa
 P. Cord Insertion:      Visualized

 Amniotic Fluid
 AFI FV:      Within normal limits

                             Largest Pocket(cm)

Biometry

 BPD:      42.2  mm     G. Age:  18w 5d         41  %    CI:        74.49   %    70 - 86
                                                         FL/HC:      17.6   %    16.1 -
 HC:      155.2  mm     G. Age:  18w 3d         19  %    HC/AC:      1.20        1.09 -
 AC:      129.5  mm     G. Age:  18w 3d         29  %    FL/BPD:     64.7   %
 FL:       27.3  mm     G. Age:  18w 2d         21  %    FL/AC:      21.1   %    20 - 24
 HUM:      26.2  mm     G. Age:  18w 1d         30  %
 CER:      18.6  mm     G. Age:  18w 2d         29  %
 NFT:       3.5  mm

 LV:        6.3  mm
 CM:        5.4  mm

 Est. FW:     240  gm      0 lb 8 oz     17  %
OB History

 Gravidity:    7         Term:   1        Prem:   1        SAB:   1
 TOP:          3        Living:  1
Gestational Age

 LMP:           18w 0d        Date:  05/06/19                 EDD:   02/10/20
 U/S Today:     18w 3d                                        EDD:   02/07/20
 Best:          19w 0d     Det. By:  Early Ultrasound         EDD:   02/03/20
                                     (06/25/19)
Anatomy

 Cranium:               Appears normal         LVOT:                   Appears normal
 Cavum:                 Appears normal         Aortic Arch:            Not well visualized
 Ventricles:            Appears normal         Ductal Arch:            Appears normal
 Choroid Plexus:        Appears normal         Diaphragm:              Appears normal
 Cerebellum:            Appears normal         Stomach:                Appears normal, left
                                                                       sided
 Posterior Fossa:       Appears normal         Abdomen:                Appears normal
 Nuchal Fold:           Appears normal         Abdominal Wall:         Appears nml (cord
                                                                       insert, abd wall)
 Face:                  Appears normal         Cord Vessels:           Appears normal (3
                        (orbits and profile)                           vessel cord)
 Lips:                  Appears normal         Kidneys:                Appear normal
 Palate:                Not well visualized    Bladder:                Appears normal
 Thoracic:              Appears normal         Spine:                  Not well visualized
 Heart:                 Appears normal         Upper Extremities:      Appears normal
                        (4CH, axis, and
                        situs)
 RVOT:                  Appears normal         Lower Extremities:      Appears normal

 Other:  Fetus appears to be a male. Heels visualized. Nasal bone visualized.
         Technically difficult due to maternal habitus and fetal position and
         fetal movement. Hands not well visualized.
Cervix Uterus Adnexa

 Cervix
 Length:           4.03  cm.
 Normal appearance by transabdominal scan.

 Uterus
 No abnormality visualized.

 Left Ovary
 Size(cm)       2.9  x   2.4    x  1.5       Vol(ml):
 Within normal limits.

 Right Ovary
 Size(cm)       2.1  x   1.5    x  1.7       Vol(ml):
 Within normal limits.
Comments

 This patient was seen for a detailed fetal anatomy scan due
 to maternal obesity and a poor prior obstetrical history.
 She denies any significant past medical history and denies
 any problems in her current pregnancy.
 She had a cell free DNA test earlier in her pregnancy which
 indicated a low risk for trisomy 21, 18, and 13. A male fetus is
 predicted.
 She was informed that the fetal growth and amniotic fluid
 level were appropriate for her gestational age.
 There were no obvious fetal anomalies noted on today's
 ultrasound exam. However, today's exam was limited due to
 the fetal position and maternal body habitus.
 The patient was informed that anomalies may be missed due
 to technical limitations. If the fetus is in a suboptimal position
 or maternal habitus is increased, visualization of the fetus in
 the maternal uterus may be impaired.
 A placenta previa was noted on today's exam.
 A follow-up exam was scheduled in 4 weeks to obtain better
 views of the fetal anatomy and to assess the placental
 location.

## 2021-12-12 IMAGING — US US MFM OB FOLLOW-UP
1 series · 13 of 28 positions shown · non-contrast
Comparison: none

[Series 1: us mfm ob follow-up · 13 of 110 slices shown]
[im 5/110]
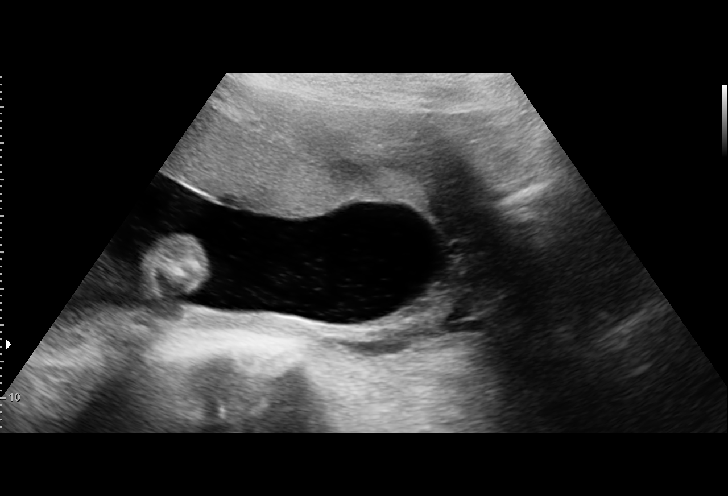
[im 13/110]
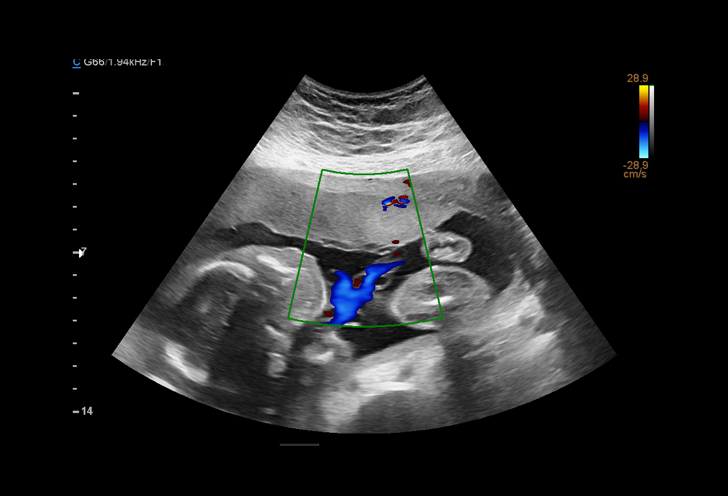
[im 21/110]
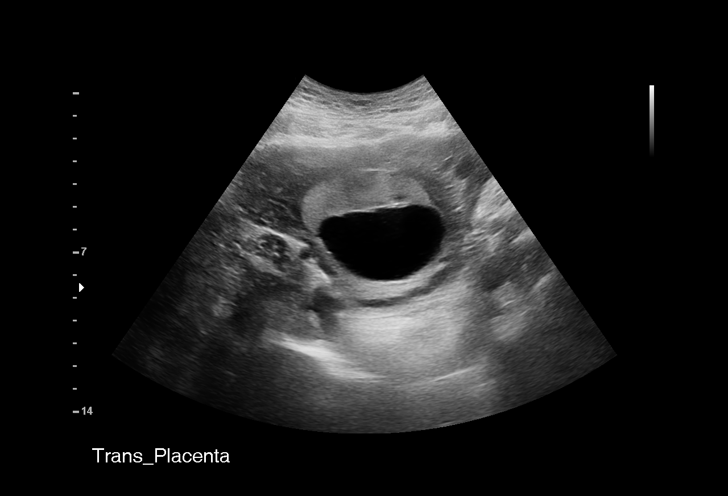
[im 29/110]
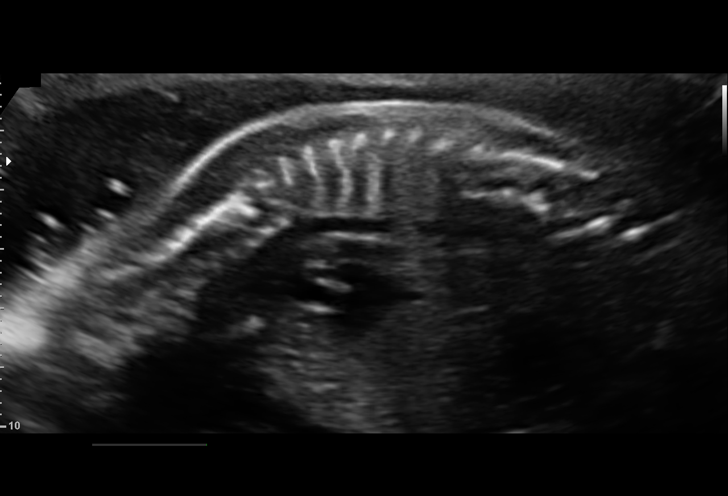
[im 37/110]
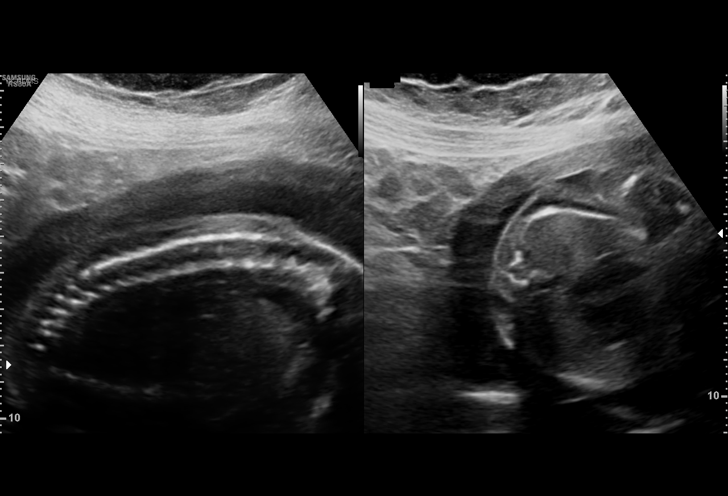
[im 45/110]
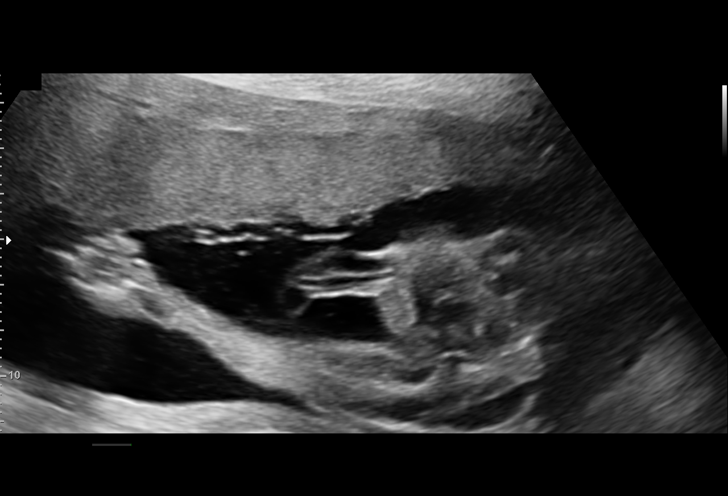
[im 57/110]
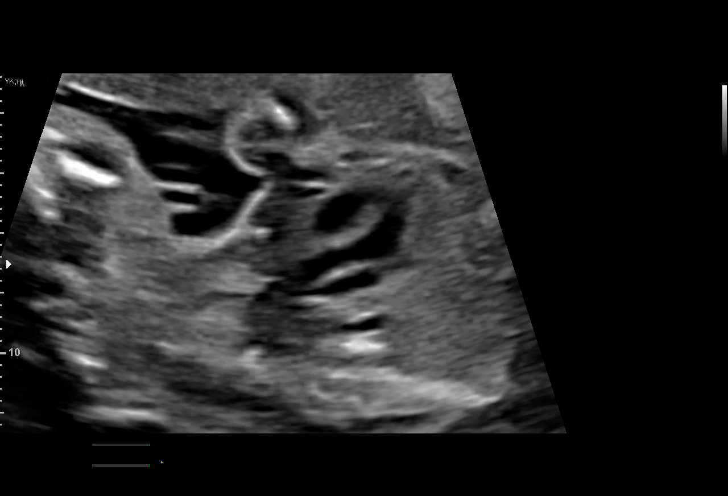
[im 65/110]
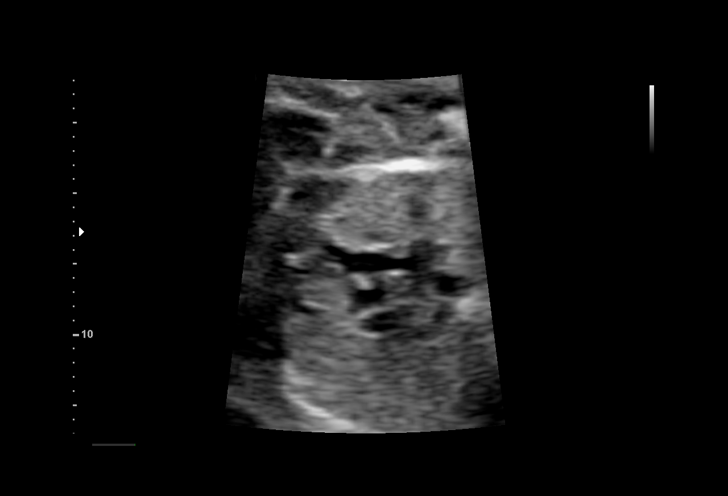
[im 73/110]
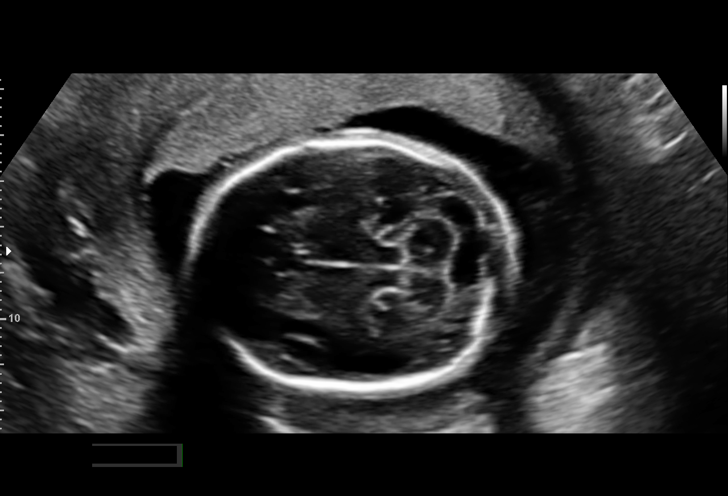
[im 81/110]
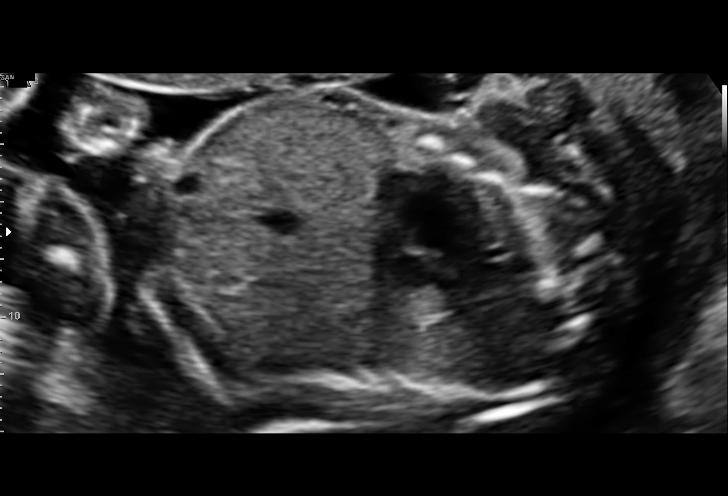
[im 89/110]
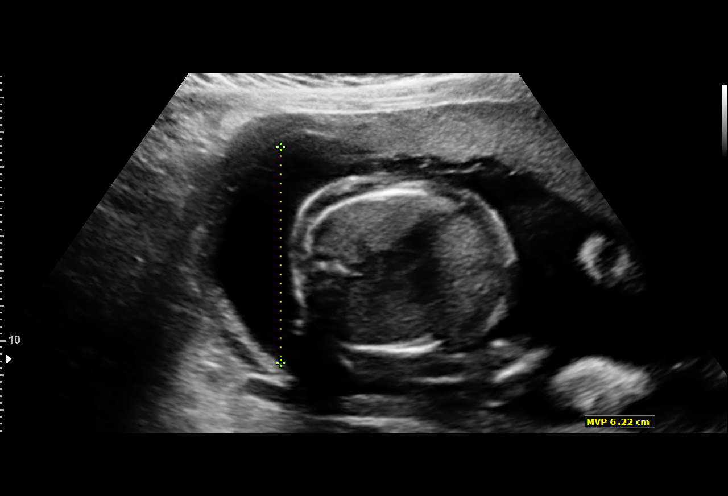
[im 97/110]
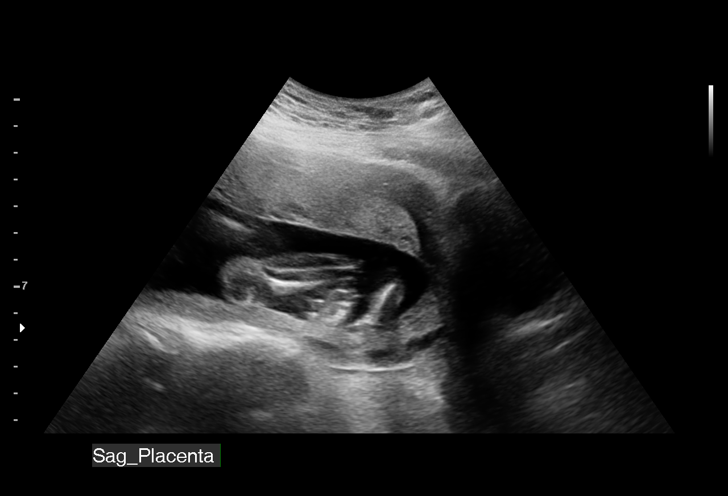
[im 105/110]
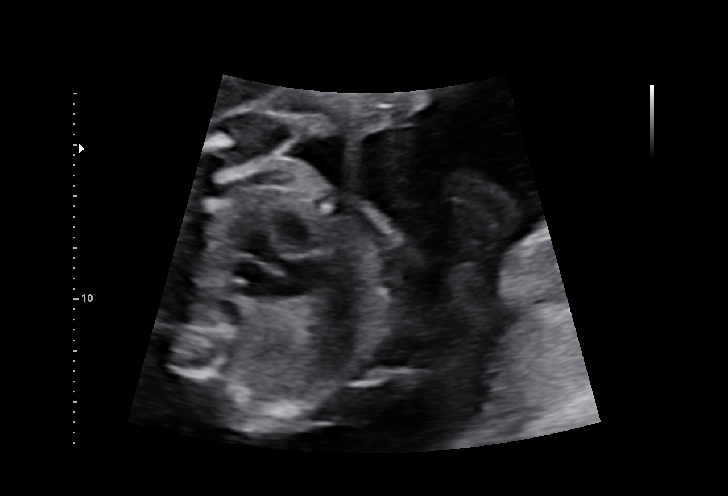

[13 of 28 positions shown; findings below may reference images not displayed]

FRANCISCO ISRRAEL CNM

 ----------------------------------------------------------------------

 ----------------------------------------------------------------------
Indications

  Obesity complicating pregnancy, second
  trimester (Pre Pregnancy BMI 30.9)
  Antenatal follow-up for nonvisualized fetal
  anatomy
  Poor obstetric history: Previous preterm
  delivery, antepartum (@ 12wks)
  History of cesarean delivery, currently
  pregnant
  History of sickle cell trait (carrier for sickle
  cell disease)
  Low risk NIPS
  23 weeks gestation of pregnancy
 ----------------------------------------------------------------------
Fetal Evaluation

 Num Of Fetuses:         1
 Fetal Heart Rate(bpm):  157
 Cardiac Activity:       Observed
 Presentation:           Variable
 Placenta:               Anterior, low-lying, 1.7cm from int os
 P. Cord Insertion:      Visualized, central

 Amniotic Fluid
 AFI FV:      Within normal limits

                             Largest Pocket(cm)

Biometry

 BPD:        54  mm     G. Age:  22w 3d         24  %    CI:        69.94   %    70 - 86
                                                         FL/HC:      18.2   %    19.2 -
 HC:       206   mm     G. Age:  22w 5d         24  %    HC/AC:      1.05        1.05 -
 AC:      195.3  mm     G. Age:  24w 2d         79  %    FL/BPD:     69.3   %    71 - 87
 FL:       37.4  mm     G. Age:  21w 6d         11  %    FL/AC:      19.2   %    20 - 24
 CER:      25.8  mm     G. Age:  23w 6d         62  %
 LV:        4.4  mm
 CM:        6.8  mm
 Est. FW:     566  gm      1 lb 4 oz     49  %
OB History

 Gravidity:    7         Term:   1        Prem:   1        SAB:   1
 TOP:          3        Living:  1
Gestational Age

 LMP:           22w 0d        Date:  05/06/19                 EDD:   02/10/20
 U/S Today:     22w 6d                                        EDD:   02/04/20
 Best:          23w 0d     Det. By:  Early Ultrasound         EDD:   02/03/20
                                     (06/25/19)
Anatomy

 Cranium:               Appears normal         LVOT:                   Appears normal
 Cavum:                 Appears normal         Aortic Arch:            Appears normal
 Ventricles:            Appears normal         Ductal Arch:            Appears normal
 Choroid Plexus:        Previously seen        Diaphragm:              Appears normal
 Cerebellum:            Appears normal         Stomach:                Appears normal, left
                                                                       sided
 Posterior Fossa:       Appears normal         Abdomen:                Appears normal
 Nuchal Fold:           Previously seen        Abdominal Wall:         Appears nml (cord
                                                                       insert, abd wall)
 Face:                  Orbits and profile     Cord Vessels:           Appears normal (3
                        previously seen                                vessel cord)
 Lips:                  Appears normal         Kidneys:                Appear normal
 Palate:                Appears normal         Bladder:                Appears normal
 Thoracic:              Appears normal         Spine:                  Appears normal
 Heart:                 Appears normal         Upper Extremities:      Previously seen
                        (4CH, axis, and
                        situs)
 RVOT:                  Appears normal         Lower Extremities:      Previously seen

 Other:  Fetus appears to be a male. Heels,nasal bone previously visualized.
         Technically difficult due to fetal movement. Hands are well visualized
         today.
Cervix Uterus Adnexa

 Cervix
 Length:           5.94  cm.
 Normal appearance by transabdominal scan.

 Uterus
 No abnormality visualized.
Comments

 This patient was seen for a follow up growth scan due to a
 placenta previa that was noted during her prior exam.  She
 denies any problems since her last exam.
 She was informed that the fetal growth and amniotic fluid
 level appears appropriate for her gestational age.
 The edge of the placenta measures about 1.7 cm away from
 the internal os, indicating that a marginal placenta previa is
 still present.  The patient was reassured that the placenta
 previa will most likely resolve later in her pregnancy.
 A follow up exam was scheduled in 6 weeks to assess the
 placental location.

## 2022-03-31 ENCOUNTER — Ambulatory Visit (HOSPITAL_COMMUNITY)
Admission: EM | Admit: 2022-03-31 | Discharge: 2022-03-31 | Disposition: A | Discharge status: Home / Self Care | Payer: Medicaid Other | Attending: Physician Assistant | Admitting: Physician Assistant

## 2022-03-31 ENCOUNTER — Encounter (HOSPITAL_COMMUNITY): Payer: Self-pay

## 2022-03-31 DIAGNOSIS — H5711 Ocular pain, right eye: Secondary | ICD-10-CM | POA: Diagnosis not present

## 2022-03-31 DIAGNOSIS — H5789 Other specified disorders of eye and adnexa: Secondary | ICD-10-CM | POA: Diagnosis not present

## 2022-03-31 DIAGNOSIS — J302 Other seasonal allergic rhinitis: Secondary | ICD-10-CM

## 2022-03-31 MED ORDER — POLYMYXIN B-TRIMETHOPRIM 10000-0.1 UNIT/ML-% OP SOLN: 1.0000 [drp] | Freq: Four times a day (QID) | OPHTHALMIC | 0 refills | Status: DC

## 2022-03-31 NOTE — Discharge Instructions (Addendum)
Advised to continue using cool compresses to the right eye to help reduce pain and swelling. Advised to use the Polytrim eyedrops 1 drop in the eye 4 times a day until the condition resolves. Advised to follow-up PCP or return to urgent care if symptoms fail to improve over the next several days

## 2022-03-31 NOTE — ED Provider Notes (Signed)
[facility]    CSN: [id number] Arrival date & time: 03/31/22  1558      History   Chief Complaint Chief Complaint  Patient presents with   Eye Problem    HPI [name] is a 36 y.o. female.   32-year-old female presents with right thigh pain, discharge and swelling.  Patient indicates for the past 3 days she has been having right eye tenderness especially of the upper lid.  Patient indicates she has been having drainage from the eye, early a.m. matting to where she has to clear the drainage away.  Patient relates the eye feels gritty. Patient indicates she is also had some mild swelling of the upper lid combined with the pain.  Patient indicates she does have vision out of both eyes but the right eye sometimes is blurry due to the drainage.  Patient indicates she is not gotten anything in the eye, no trauma to the eye.  Patient relates she has not been around any family or friends with any infectious eye conditions.  Patient does not wear contacts.  Patient relates she does have a history of having seasonal allergies with frontal and maxillary sinus congestion and pressure with rhinitis.  Patient relates she does take Flonase, and Claritin but these do not control her symptoms.   Eye Problem Associated symptoms: discharge (yellow) and photophobia     Past Medical History:  Diagnosis Date   Acute sinusitis 01/24/2016   Chlamydia    age 32   Coughing/wheezing 01/24/2016   Hemorrhoids 11/13/2019   Recurrent sinus infections 04/12/2016   Seasonal allergies    Trichomonal vaginitis during pregnancy in first trimester 08/26/2019   TOC negative on 12/15    Patient Active Problem List   Diagnosis Date Noted   Postpartum care following cesarean delivery 01/08/2020   Unwanted fertility 11/13/2019   History of preterm delivery 08/26/2019   Tobacco use 04/12/2016   Perennial and seasonal allergic rhinitis 01/24/2016   Seasonal allergic conjunctivitis 01/24/2016     Past Surgical History:  Procedure Laterality Date   APPENDECTOMY     CESAREAN SECTION      OB History     Gravida  7   Para  3   Term  1   Preterm  2   AB  4   Living  2      SAB      IAB  3   Ectopic      Multiple      Live Births  3            Home Medications    Prior to Admission medications   Medication Sig Start Date End Date Taking? Authorizing Provider  cetirizine (ZYRTEC) 10 MG tablet Take 10 mg by mouth daily.   Yes [provider]  diphenhydrAMINE (BENADRYL) 25 MG tablet Take 25 mg by mouth.   Yes [provider]  trimethoprim-polymyxin b (POLYTRIM) ophthalmic solution Place 1 drop into the right eye every 6 (six) hours. 03/31/22  Yes Ellsworth Lennox, PA-C    Family History Family History  Problem Relation Age of Onset   Hypertension Mother    Hypertension Father    Diabetes Father    Hypertension Sister    Asthma Sister    Hypertension Brother    Asthma Brother    Asthma Daughter     Social History Social History   Tobacco Use   Smoking status: Former    Packs/day: 0.50  Years: 10.00    Total pack years: 5.00    Types: Cigarettes   Smokeless tobacco: Never  Vaping Use   Vaping Use: Never used  Substance Use Topics   Alcohol use: No   Drug use: No     Allergies   Shellfish allergy   Review of Systems Review of Systems  Eyes:  Positive for photophobia, pain and discharge (yellow).     Physical Exam Triage Vital Signs ED Triage Vitals  Enc Vitals Group     BP 03/31/22 1644 (!) 138/95     Pulse Rate 03/31/22 1644 97     Resp 03/31/22 1644 18     Temp 03/31/22 1644 98.6 F (37 C)     Temp Source 03/31/22 1644 Oral     SpO2 03/31/22 1644 97 %     Weight --      Height --      Head Circumference --      Peak Flow --      Pain Score 03/31/22 1646 7     Pain Loc --      Pain Edu? --      Excl. in GC? --    No data found.  Updated Vital Signs BP (!) 138/95 (BP Location: Left Arm)    Pulse 97   Temp 98.6 F (37 C) (Oral)   Resp 18   LMP 03/03/2022   SpO2 97%   Visual Acuity Right Eye Distance:   Left Eye Distance:   Bilateral Distance:    Right Eye Near:   Left Eye Near:    Bilateral Near:     Physical Exam Constitutional:      Appearance: Normal appearance.  HENT:     Right Ear: Tympanic membrane and ear canal normal.     Left Ear: Tympanic membrane and ear canal normal.     Mouth/Throat:     Mouth: Mucous membranes are moist.     Pharynx: Oropharynx is clear. No pharyngeal swelling or posterior oropharyngeal erythema.  Eyes:     Extraocular Movements: Extraocular movements intact.     Conjunctiva/sclera: Conjunctivae normal.     Comments: Eyes: Right now with upper lid mild redness and swelling present with tenderness on palpation along the lacrimal duct area.  Conjunctiva is without injection.  There is purulent drainage present from the inner aspect of the lacrimal areas.  There is no swelling of the lower lid.  PERRLA. The left upper and lower eyelid is normal  Neurological:     Mental Status: She is alert.      UC Treatments / Results  Labs (all labs ordered are listed, but only abnormal results are displayed) Labs Reviewed - No data to display  EKG   Radiology No results found.  Procedures Procedures (including critical care time)  Medications Ordered in UC Medications - No data to display  Initial Impression / Assessment and Plan / UC Course  I have reviewed the triage vital signs and the nursing notes.  Pertinent labs & imaging results that were available during my care of the patient were reviewed by me and considered in my medical decision making (see chart for details).    Plan: 1.  Advised to use Polytrim eyedrops 1 drop in the eye 4 times a day until the condition clears. 2.  Advised to continue cool compresses on a frequent basis to reduce the swelling and the eye irritation. 2.  Advised to follow-up with PCP or return  to  urgent care if symptoms fail to improve over the next 3 to 4 days. Final Clinical Impressions(s) / UC Diagnoses   Final diagnoses:  Eye irritation  Acute right eye pain  Seasonal allergies     Discharge Instructions      Advised to continue using cool compresses to the right eye to help reduce pain and swelling. Advised to use the Polytrim eyedrops 1 drop in the eye 4 times a day until the condition resolves. Advised to follow-up PCP or return to urgent care if symptoms fail to improve over the next several days    ED Prescriptions     Medication Sig Dispense Auth. Provider   trimethoprim-polymyxin b (POLYTRIM) ophthalmic solution Place 1 drop into the right eye every 6 (six) hours. 10 mL Ellsworth Lennox, PA-C      PDMP not reviewed this encounter.   Ellsworth Lennox, PA-C 03/31/22 1712

## 2022-03-31 NOTE — ED Triage Notes (Signed)
Pt states that she has 3 days of right eye pain, swelling and yellowish drainage.

## 2023-02-08 ENCOUNTER — Ambulatory Visit: Payer: Medicaid Other | Admitting: Allergy & Immunology

## 2023-05-09 ENCOUNTER — Encounter: Payer: Self-pay | Admitting: Psychiatry

## 2023-05-09 ENCOUNTER — Ambulatory Visit: Payer: Medicaid Other | Admitting: Psychiatry

## 2023-05-09 NOTE — Progress Notes (Deleted)
Referring:  Serena Colonel, MD [address] [place],  [place] [address]  PCP: [facility] was asked to evaluate [name], a 37 year old female for a chief complaint of headaches.  Our recommendations of care will be communicated by shared medical record.    CC:  headaches  History provided from ***  HPI:  Medical co-morbidities: TMJ, chronic sinusitis  The patient presents for evaluation of headaches which began***  Headache History: Onset: Triggers: Aura: Location: Quality/Description: Associated Symptoms:  Photophobia:  Phonophobia:  Nausea: Vomiting: Allodynia: Other symptoms: Worse with activity?: Duration of headaches:  Headache days per month: *** Migraine days per month: *** Headache free days per month: ***  Current Treatment: Abortive ***  Preventative ***  Prior Therapies                                 ***   LABS: ***  IMAGING:  ***  ***Imaging independently reviewed on May 09, 2023   Current Outpatient Medications on File Prior to Visit  Medication Sig Dispense Refill   cetirizine (ZYRTEC) 10 MG tablet Take 10 mg by mouth daily.     diphenhydrAMINE (BENADRYL) 25 MG tablet Take 25 mg by mouth.     trimethoprim-polymyxin b (POLYTRIM) ophthalmic solution Place 1 drop into the right eye every 6 (six) hours. 10 mL 0   No current facility-administered medications on file prior to visit.     Allergies: Allergies  Allergen Reactions   Shellfish Allergy Anaphylaxis    POSITIVE ALLERGY TEST    Family History: Migraine or other headaches in the family:  *** Aneurysms in a first degree relative:  *** Brain tumors in the family:  *** Other neurological illness in the family:   ***  Past Medical History: Past Medical History:  Diagnosis Date   Acute sinusitis 01/24/2016   Chlamydia    age 67   Coughing/wheezing 01/24/2016   Hemorrhoids 11/13/2019   Recurrent sinus infections 04/12/2016    Seasonal allergies    Trichomonal vaginitis during pregnancy in first trimester 08/26/2019   TOC negative on 12/15    Past Surgical History Past Surgical History:  Procedure Laterality Date   APPENDECTOMY     CESAREAN SECTION      Social History: Social History   Tobacco Use   Smoking status: Former    Current packs/day: 0.50    Average packs/day: 0.5 packs/day for 10.0 years (5.0 ttl pk-yrs)    Types: Cigarettes   Smokeless tobacco: Never  Vaping Use   Vaping status: Never Used  Substance Use Topics   Alcohol use: No   Drug use: No   ***  ROS: Negative for fevers, chills. Positive for***. All other systems reviewed and negative unless stated otherwise in HPI.   Physical Exam:   Vital Signs: There were no vitals taken for this visit. GENERAL: well appearing,in no acute distress,alert SKIN:  Color, texture, turgor normal. No rashes or lesions HEAD:  Normocephalic/atraumatic. CV:  RRR RESP: Normal respiratory effort MSK: no tenderness to palpation over occiput, neck, or shoulders  NEUROLOGICAL: Mental Status: Alert, oriented to person, place and time,Follows commands Cranial Nerves: PERRL, visual fields intact to confrontation, extraocular movements intact, facial sensation intact, no facial droop or ptosis, hearing grossly intact, no dysarthria, palate elevate symmetrically, tongue protrudes midline, shoulder shrug intact and symmetric Motor: muscle strength 5/5 both upper and lower extremities,no drift,  normal tone Reflexes: 2+ throughout Sensation: intact to light touch all 4 extremities Coordination: Finger-to- nose-finger intact bilaterally Gait: normal-based   IMPRESSION: ***  PLAN: ***   I spent a total of *** minutes chart reviewing and counseling the patient. Headache education was done. Discussed treatment options including preventive and acute medications, natural supplements, and physical therapy. Discussed medication overuse headache and to  limit use of acute treatments to no more than 2 days/week or 10 days/month. Discussed medication side effects, adverse reactions and drug interactions. Written educational materials and patient instructions outlining all of the above were given.  Follow-up: ***   Ocie Doyne, MD 05/09/2023   12:41 PM

## 2023-06-23 ENCOUNTER — Other Ambulatory Visit: Payer: Self-pay

## 2023-06-23 ENCOUNTER — Encounter (HOSPITAL_COMMUNITY): Payer: Self-pay | Admitting: *Deleted

## 2023-06-23 ENCOUNTER — Ambulatory Visit (HOSPITAL_COMMUNITY)
Admission: EM | Admit: 2023-06-23 | Discharge: 2023-06-23 | Disposition: A | Discharge status: Home / Self Care | Payer: Medicaid Other | Attending: Internal Medicine | Admitting: Internal Medicine

## 2023-06-23 DIAGNOSIS — Z1152 Encounter for screening for COVID-19: Secondary | ICD-10-CM | POA: Insufficient documentation

## 2023-06-23 DIAGNOSIS — J069 Acute upper respiratory infection, unspecified: Secondary | ICD-10-CM | POA: Diagnosis present

## 2023-06-23 LAB — POCT RAPID STREP A (OFFICE): Rapid Strep A Screen: NEGATIVE

## 2023-06-23 MED ORDER — BENZONATATE 100 MG PO CAPS: 100.0000 mg | ORAL_CAPSULE | Freq: Three times a day (TID) | ORAL | 0 refills | Status: DC

## 2023-06-23 MED ORDER — IBUPROFEN 600 MG PO TABS: 600.0000 mg | ORAL_TABLET | Freq: Four times a day (QID) | ORAL | 0 refills | Status: DC | PRN

## 2023-06-23 NOTE — ED Triage Notes (Signed)
Pt reports she has a sore throat and feels a knot on her throat. Pt also has general muscle aches and cough.

## 2023-06-23 NOTE — ED Provider Notes (Signed)
[facility]    CSN: [id number] Arrival date & time: 06/23/23  1647      History   Chief Complaint Chief Complaint  Patient presents with   knot on throat   Sore Throat   Generalized Body Aches    HPI [name] is a 37 y.o. female.   [name] is a 37 y.o. female presenting for chief complaint of sore throat that started yesterday morning. She describes the sore throat as a "knot in the throat" that is worsened by swallowing. Reports body aches to the bilateral upper extremities and the upper back as well. Cough is sometimes productive and sometimes dry. Sore throat is worsened by swallowing. She has also had some nasal congestion without headaches, fever, chills, N/V/D, abdominal pain, or rash. Son is sick with cough currently, otherwise no sick contacts. Current everyday cigarette smoker, denies other drug use. Denies history of chronic respiratory problems/asthma. Has not used any OTC medicines to help with symptoms.    Sore Throat    Past Medical History:  Diagnosis Date   Acute sinusitis 01/24/2016   Chlamydia    age 31   Coughing/wheezing 01/24/2016   Hemorrhoids 11/13/2019   Recurrent sinus infections 04/12/2016   Seasonal allergies    Trichomonal vaginitis during pregnancy in first trimester 08/26/2019   TOC negative on 12/15    Patient Active Problem List   Diagnosis Date Noted   Postpartum care following cesarean delivery 01/08/2020   Unwanted fertility 11/13/2019   History of preterm delivery 08/26/2019   Tobacco use 04/12/2016   Perennial and seasonal allergic rhinitis 01/24/2016   Seasonal allergic conjunctivitis 01/24/2016    Past Surgical History:  Procedure Laterality Date   APPENDECTOMY     CESAREAN SECTION      OB History     Gravida  7   Para  3   Term  1   Preterm  2   AB  4   Living  2      SAB      IAB  3   Ectopic      Multiple      Live Births  3            Home Medications     Prior to Admission medications   Medication Sig Start Date End Date Taking? Authorizing Provider  benzonatate (TESSALON) 100 MG capsule Take 1 capsule (100 mg total) by mouth every 8 (eight) hours. 06/23/23  Yes Carlisle Beers, FNP  cetirizine (ZYRTEC) 10 MG tablet Take 10 mg by mouth daily.   Yes [provider]  diphenhydrAMINE (BENADRYL) 25 MG tablet Take 25 mg by mouth.   Yes [provider]  ibuprofen (ADVIL) 600 MG tablet Take 1 tablet (600 mg total) by mouth every 6 (six) hours as needed. 06/23/23  Yes Carlisle Beers, FNP  trimethoprim-polymyxin b (POLYTRIM) ophthalmic solution Place 1 drop into the right eye every 6 (six) hours. 03/31/22   Ellsworth Lennox, PA-C    Family History Family History  Problem Relation Age of Onset   Hypertension Mother    Hypertension Father    Diabetes Father    Hypertension Sister    Asthma Sister    Hypertension Brother    Asthma Brother    Asthma Daughter     Social History Social History   Tobacco Use   Smoking status: Former    Current packs/day: 0.50    Average packs/day: 0.5 packs/day for 10.0  years (5.0 ttl pk-yrs)    Types: Cigarettes   Smokeless tobacco: Never  Vaping Use   Vaping status: Never Used  Substance Use Topics   Alcohol use: No   Drug use: No     Allergies   Shellfish allergy   Review of Systems Review of Systems Per HPI  Physical Exam Triage Vital Signs ED Triage Vitals  Encounter Vitals Group     BP 06/23/23 1750 (!) 136/91     Systolic BP Percentile --      Diastolic BP Percentile --      Pulse Rate 06/23/23 1750 97     Resp 06/23/23 1750 20     Temp 06/23/23 1750 98.7 F (37.1 C)     Temp src --      SpO2 06/23/23 1750 97 %     Weight --      Height --      Head Circumference --      Peak Flow --      Pain Score 06/23/23 1748 8     Pain Loc --      Pain Education --      Exclude from Growth Chart --    No data found.  Updated Vital Signs BP (!) 136/91    Pulse 97   Temp 98.7 F (37.1 C)   Resp 20   LMP 05/27/2023   SpO2 97%   Visual Acuity Right Eye Distance:   Left Eye Distance:   Bilateral Distance:    Right Eye Near:   Left Eye Near:    Bilateral Near:     Physical Exam Vitals and nursing note reviewed.  Constitutional:      Appearance: She is not ill-appearing or toxic-appearing.  HENT:     Head: Normocephalic and atraumatic.     Right Ear: Hearing, tympanic membrane, ear canal and external ear normal.     Left Ear: Hearing, tympanic membrane, ear canal and external ear normal.     Nose: Nose normal. No congestion or rhinorrhea.     Mouth/Throat:     Lips: Pink.     Mouth: Mucous membranes are moist. No injury.     Tongue: No lesions. Tongue does not deviate from midline.     Palate: No mass and lesions.     Pharynx: Uvula midline. Pharyngeal swelling and posterior oropharyngeal erythema present. No oropharyngeal exudate or uvula swelling.     Tonsils: No tonsillar exudate or tonsillar abscesses. 2+ on the right. 2+ on the left.  Eyes:     General: Lids are normal. Vision grossly intact. Gaze aligned appropriately.     Extraocular Movements: Extraocular movements intact.     Conjunctiva/sclera: Conjunctivae normal.  Cardiovascular:     Rate and Rhythm: Normal rate and regular rhythm.     Heart sounds: Normal heart sounds, S1 normal and S2 normal.  Pulmonary:     Effort: Pulmonary effort is normal. No respiratory distress.     Breath sounds: Normal breath sounds and air entry. No wheezing, rhonchi or rales.  Chest:     Chest wall: No tenderness.  Musculoskeletal:     Cervical back: Neck supple.  Lymphadenopathy:     Cervical: No cervical adenopathy.  Skin:    General: Skin is warm and dry.     Capillary Refill: Capillary refill takes less than 2 seconds.     Findings: No rash.  Neurological:     General: No focal deficit present.  Mental Status: She is alert and oriented to person, place, and time. Mental  status is at baseline.     Cranial Nerves: No dysarthria or facial asymmetry.  Psychiatric:        Mood and Affect: Mood normal.        Speech: Speech normal.        Behavior: Behavior normal.        Thought Content: Thought content normal.        Judgment: Judgment normal.      UC Treatments / Results  Labs (all labs ordered are listed, but only abnormal results are displayed) Labs Reviewed  SARS CORONAVIRUS 2 (TAT 6-24 HRS)  CULTURE, GROUP A STREP [facility])  POCT RAPID STREP A (OFFICE)    EKG   Radiology No results found.  Procedures Procedures (including critical care time)  Medications Ordered in UC Medications - No data to display  Initial Impression / Assessment and Plan / UC Course  I have reviewed the triage vital signs and the nursing notes.  Pertinent labs & imaging results that were available during my care of the patient were reviewed by me and considered in my medical decision making (see chart for details).   1. Viral URI with cough Suspect viral URI, viral syndrome. Physical exam findings reassuring, vital signs hemodynamically stable. Low suspicion for pneumonia/acute cardiopulmonary abnormality, therefore deferred imaging of the chest. Advised supportive care, offered prescriptions for symptomatic relief.  Recommend continued use of OTC medications as needed, recommendations discussed with patient/caregiver and outlined in AVS.  Strep/viral testing: COVID-19 testing is pending. Group A strep testing in clinic negative, throat culture pending. CDC guidelines discussed regarding COVID-19.   Counseled patient on potential for adverse effects with medications prescribed/recommended today, strict ER and return-to-clinic precautions discussed, patient verbalized understanding.    Final Clinical Impressions(s) / UC Diagnoses   Final diagnoses:  Viral URI with cough     Discharge Instructions      You have a viral illness which will improve on its own  with rest, fluids, and medications to help with your symptoms. We discussed prescriptions that may help with your symptoms: tessalon perles every 8 hours as needed for cough You may use over the counter medicines as needed: tylenol/motrin, mucinex, zyrtec, Flonase Two teaspoons of honey in 1 cup of warm water every 4-6 hours may help with throat pains. Humidifier in room at nighttime may help soothe cough (clean well daily).   For chest pain, shortness of breath, inability to keep food or fluids down without vomiting, fever that does not respond to tylenol or motrin, or any other severe symptoms, please go to the ER for further evaluation. Return to urgent care as needed, otherwise follow-up with PCP.       ED Prescriptions     Medication Sig Dispense Auth. Provider   benzonatate (TESSALON) 100 MG capsule Take 1 capsule (100 mg total) by mouth every 8 (eight) hours. 21 capsule Reita May M, FNP   ibuprofen (ADVIL) 600 MG tablet Take 1 tablet (600 mg total) by mouth every 6 (six) hours as needed. 30 tablet Carlisle Beers, FNP      PDMP not reviewed this encounter.   Carlisle Beers, [place] 06/23/23 2103

## 2023-06-23 NOTE — Discharge Instructions (Addendum)
You have a viral illness which will improve on its own with rest, fluids, and medications to help with your symptoms. We discussed prescriptions that may help with your symptoms: tessalon perles every 8 hours as needed for cough You may use over the counter medicines as needed: tylenol/motrin, mucinex, zyrtec, Flonase Two teaspoons of honey in 1 cup of warm water every 4-6 hours may help with throat pains. Humidifier in room at nighttime may help soothe cough (clean well daily).   For chest pain, shortness of breath, inability to keep food or fluids down without vomiting, fever that does not respond to tylenol or motrin, or any other severe symptoms, please go to the ER for further evaluation. Return to urgent care as needed, otherwise follow-up with PCP.

## 2023-06-24 LAB — SARS CORONAVIRUS 2 (TAT 6-24 HRS): SARS Coronavirus 2: NEGATIVE

## 2023-06-26 LAB — CULTURE, GROUP A STREP (THRC)

## 2023-07-26 ENCOUNTER — Encounter (HOSPITAL_COMMUNITY): Payer: Self-pay

## 2023-07-26 ENCOUNTER — Other Ambulatory Visit: Payer: Self-pay

## 2023-07-26 ENCOUNTER — Ambulatory Visit (HOSPITAL_COMMUNITY)
Admission: EM | Admit: 2023-07-26 | Discharge: 2023-07-26 | Disposition: A | Discharge status: Home / Self Care | Payer: Medicaid Other | Attending: Family Medicine | Admitting: Family Medicine

## 2023-07-26 ENCOUNTER — Emergency Department (HOSPITAL_COMMUNITY)
Admission: EM | Admit: 2023-07-26 | Discharge: 2023-07-26 | Disposition: A | Discharge status: Home / Self Care | Payer: Medicaid Other | Attending: Emergency Medicine | Admitting: Emergency Medicine

## 2023-07-26 ENCOUNTER — Emergency Department (HOSPITAL_COMMUNITY): Payer: Medicaid Other

## 2023-07-26 DIAGNOSIS — J029 Acute pharyngitis, unspecified: Secondary | ICD-10-CM | POA: Diagnosis present

## 2023-07-26 DIAGNOSIS — J019 Acute sinusitis, unspecified: Secondary | ICD-10-CM

## 2023-07-26 DIAGNOSIS — R0981 Nasal congestion: Secondary | ICD-10-CM | POA: Diagnosis not present

## 2023-07-26 DIAGNOSIS — Z1152 Encounter for screening for COVID-19: Secondary | ICD-10-CM | POA: Insufficient documentation

## 2023-07-26 LAB — POCT RAPID STREP A (OFFICE): Rapid Strep A Screen: NEGATIVE

## 2023-07-26 LAB — RESP PANEL BY RT-PCR (RSV, FLU A&B, COVID)  RVPGX2
Influenza A by PCR: NEGATIVE
Influenza B by PCR: NEGATIVE
Resp Syncytial Virus by PCR: NEGATIVE
SARS Coronavirus 2 by RT PCR: NEGATIVE

## 2023-07-26 MED ORDER — PHENOL 1.4 % MT LIQD
1.0000 | OROMUCOSAL | Status: DC | PRN
Start: 2023-07-26 — End: 2023-07-27
  Filled 2023-07-26: qty 177

## 2023-07-26 MED ORDER — DOXYCYCLINE HYCLATE 100 MG PO CAPS: 100.0000 mg | ORAL_CAPSULE | Freq: Two times a day (BID) | ORAL | 0 refills | Status: AC

## 2023-07-26 MED ORDER — KETOROLAC TROMETHAMINE 15 MG/ML IJ SOLN
15.0000 mg | Freq: Once | INTRAMUSCULAR | Status: AC
  Administered 2023-07-26: 15 mg via INTRAMUSCULAR
  Filled 2023-07-26: qty 1

## 2023-07-26 MED ORDER — NAPROXEN 375 MG PO TABS: 375.0000 mg | ORAL_TABLET | Freq: Two times a day (BID) | ORAL | 0 refills | Status: AC

## 2023-07-26 MED ORDER — PROMETHAZINE-DM 6.25-15 MG/5ML PO SYRP: 5.0000 mL | ORAL_SOLUTION | Freq: Four times a day (QID) | ORAL | 0 refills | Status: DC | PRN

## 2023-07-26 MED ORDER — GENTAMICIN SULFATE 0.3 % OP SOLN: 2.0000 [drp] | Freq: Three times a day (TID) | OPHTHALMIC | 0 refills | Status: AC

## 2023-07-26 NOTE — ED Provider Notes (Signed)
[facility]    CSN: [id number] Arrival date & time: 07/26/23  7829      History   Chief Complaint Chief Complaint  Patient presents with   Sore Throat   Nasal Congestion   Cough   Eye Dryness    HPI [name] is a 37 y.o. female.    Sore Throat  Cough Here for sore throat and cough and congestion.  Symptoms began about 8 days ago.  She is not measured any fever but has had chills and myalgia.  She has had a lot of sinus pressure in the last few days.  Then today her sore throat worsened and she can see some exudate in her oropharynx.  No nausea vomiting or diarrhea.  She has been exposed to her kids who had various upper respiratory infections and pneumonia.    No allergies to medications  Last menstrual cycle was November 7 through November 12 Past Medical History:  Diagnosis Date   Acute sinusitis 01/24/2016   Chlamydia    age 38   Coughing/wheezing 01/24/2016   Hemorrhoids 11/13/2019   Recurrent sinus infections 04/12/2016   Seasonal allergies    Trichomonal vaginitis during pregnancy in first trimester 08/26/2019   TOC negative on 12/15    Patient Active Problem List   Diagnosis Date Noted   Postpartum care following cesarean delivery 01/08/2020   Unwanted fertility 11/13/2019   History of preterm delivery 08/26/2019   Tobacco use 04/12/2016   Perennial and seasonal allergic rhinitis 01/24/2016   Seasonal allergic conjunctivitis 01/24/2016    Past Surgical History:  Procedure Laterality Date   APPENDECTOMY     CESAREAN SECTION      OB History     Gravida  7   Para  3   Term  1   Preterm  2   AB  4   Living  2      SAB      IAB  3   Ectopic      Multiple      Live Births  3            Home Medications    Prior to Admission medications   Medication Sig Start Date End Date Taking? Authorizing Provider  amphetamine-dextroamphetamine (ADDERALL) 30 MG tablet Take 1 tablet by mouth daily. 07/10/23  Yes  [provider]  doxycycline (VIBRAMYCIN) 100 MG capsule Take 1 capsule (100 mg total) by mouth 2 (two) times daily for 7 days. 07/26/23 08/02/23 Yes Doreen Garretson, Janace Aris, MD  gentamicin (GARAMYCIN) 0.3 % ophthalmic solution Place 2 drops into both eyes 3 (three) times daily for 5 days. 07/26/23 07/31/23 Yes Zenia Resides, MD  promethazine-dextromethorphan (PROMETHAZINE-DM) 6.25-15 MG/5ML syrup Take 5 mLs by mouth 4 (four) times daily as needed for cough. 07/26/23  Yes Zenia Resides, MD  cetirizine (ZYRTEC) 10 MG tablet Take 10 mg by mouth daily.    [provider]  diphenhydrAMINE (BENADRYL) 25 MG tablet Take 25 mg by mouth.    [provider]    Family History Family History  Problem Relation Age of Onset   Hypertension Mother    Hypertension Father    Diabetes Father    Hypertension Sister    Asthma Sister    Hypertension Brother    Asthma Brother    Asthma Daughter     Social History Social History   Tobacco Use   Smoking status: Former    Current packs/day: 0.50  Average packs/day: 0.5 packs/day for 10.0 years (5.0 ttl pk-yrs)    Types: Cigarettes   Smokeless tobacco: Never  Vaping Use   Vaping status: Never Used  Substance Use Topics   Alcohol use: No   Drug use: No     Allergies   Shellfish allergy   Review of Systems Review of Systems  Respiratory:  Positive for cough.      Physical Exam Triage Vital Signs ED Triage Vitals  Encounter Vitals Group     BP 07/26/23 0937 (!) 135/99     Systolic BP Percentile --      Diastolic BP Percentile --      Pulse Rate 07/26/23 0937 (!) 102     Resp 07/26/23 0937 18     Temp 07/26/23 0937 98.4 F (36.9 C)     Temp Source 07/26/23 0937 Oral     SpO2 07/26/23 0937 98 %     Weight --      Height 07/26/23 0938 5\' 4"  (1.626 m)     Head Circumference --      Peak Flow --      Pain Score 07/26/23 0938 10     Pain Loc --      Pain Education --      Exclude from Growth Chart --     No data found.  Updated Vital Signs BP (!) 135/99 (BP Location: Right Arm)   Pulse (!) 102   Temp 98.4 F (36.9 C) (Oral)   Resp 18   Ht 5\' 4"  (1.626 m)   LMP 07/24/2023 (Exact Date)   SpO2 98%   Breastfeeding No   BMI 31.36 kg/m   Visual Acuity Right Eye Distance:   Left Eye Distance:   Bilateral Distance:    Right Eye Near:   Left Eye Near:    Bilateral Near:     Physical Exam Vitals reviewed.  Constitutional:      General: She is not in acute distress.    Appearance: She is not toxic-appearing.  HENT:     Nose: Nose normal.     Mouth/Throat:     Mouth: Mucous membranes are moist.     Comments: There is very mild erythema of the tonsils and the tonsils are hypertrophied 1+.  There is white exudate in the tonsillar crypts on both sides, right more than left.  Uvula is in the midline and there is no other asymmetry. Eyes:     Extraocular Movements: Extraocular movements intact.     Pupils: Pupils are equal, round, and reactive to light.     Comments: There is injection of bilateral conjunctiva.  The lids are nonswollen  Cardiovascular:     Rate and Rhythm: Normal rate and regular rhythm.     Heart sounds: No murmur heard. Pulmonary:     Effort: No respiratory distress.     Breath sounds: No stridor. No wheezing, rhonchi or rales.  Musculoskeletal:     Cervical back: Neck supple.  Lymphadenopathy:     Cervical: No cervical adenopathy.  Skin:    Capillary Refill: Capillary refill takes less than 2 seconds.     Coloration: Skin is not jaundiced or pale.  Neurological:     General: No focal deficit present.     Mental Status: She is alert and oriented to person, place, and time.  Psychiatric:        Behavior: Behavior normal.      UC Treatments / Results  Labs (all labs ordered  are listed, but only abnormal results are displayed) Labs Reviewed  POCT RAPID STREP A (OFFICE)    EKG   Radiology No results found.  Procedures Procedures (including  critical care time)  Medications Ordered in UC Medications - No data to display  Initial Impression / Assessment and Plan / UC Course  I have reviewed the triage vital signs and the nursing notes.  Pertinent labs & imaging results that were available during my care of the patient were reviewed by me and considered in my medical decision making (see chart for details).     Rapid strep is negative.  I am going to treat the acute sinusitis, so I am not going to send a backup culture.  Gentamicin is sent in for the conjunctivitis, and Phenergan with dextromethorphan is sent in for the cough.  Doxycycline is sent in for the sinusitis, which would in turn also cover for atypical, since she mentioned child that had Final Clinical Impressions(s) / UC Diagnoses   Final diagnoses:  Sore throat  Acute sinusitis, recurrence not specified, unspecified location     Discharge Instructions      Your strep test is negative.    Take doxycycline 100 mg --1 capsule 2 times daily for 7 days  Put gentamicin eyedrops in the affected eye(s) 3 times daily for 5 days.  Take Phenergan with dextromethorphan syrup--5 mL or 1 teaspoon every 6 hours as needed for cough      ED Prescriptions     Medication Sig Dispense Auth. Provider   doxycycline (VIBRAMYCIN) 100 MG capsule Take 1 capsule (100 mg total) by mouth 2 (two) times daily for 7 days. 14 capsule Zenia Resides, MD   promethazine-dextromethorphan (PROMETHAZINE-DM) 6.25-15 MG/5ML syrup Take 5 mLs by mouth 4 (four) times daily as needed for cough. 118 mL Zenia Resides, MD   gentamicin (GARAMYCIN) 0.3 % ophthalmic solution Place 2 drops into both eyes 3 (three) times daily for 5 days. 5 mL Zenia Resides, MD      PDMP not reviewed this encounter.   Zenia Resides, MD 07/26/23 1020

## 2023-07-26 NOTE — ED Triage Notes (Addendum)
Pt presents with throat pain, nasal congestion, back pain, chest pain, bilateral ear pain, and itchy eyes x 2 weeks. Pt reports her son was recently diagnosed with pneumonia and she was his primary caretaker. This morning when pt woke up, she states "I felt like the pain in my throat worsened, I saw a black spot and a white spot. It looks like strep to me." Pt has been taking Ibuprofen, Benadryl, eye drops, and mentioned OTC medications with no improvement in symptoms.  Pt denies fevers at this time.

## 2023-07-26 NOTE — Discharge Instructions (Signed)
Your strep test is negative.    Take doxycycline 100 mg --1 capsule 2 times daily for 7 days  Put gentamicin eyedrops in the affected eye(s) 3 times daily for 5 days.  Take Phenergan with dextromethorphan syrup--5 mL or 1 teaspoon every 6 hours as needed for cough

## 2023-07-26 NOTE — ED Triage Notes (Signed)
C/o sore throat, headache, productive cough, n/v nasal congestion, bilateral ear pressure x 1 week.  Patient seen at [facility] this am.

## 2023-07-26 NOTE — ED Provider Notes (Signed)
[place] [facility] AT [facility] Provider Note   CSN: [id number] Arrival date & time: 07/26/23  1942     History  Chief Complaint  Patient presents with   Sore Throat    [name] is a 37 y.o. female with past medical history of ADHD presents to emergency department for evaluation of sore throat, nasal congestion, pleuritic chest pain, productive cough that has been occurring for a total of 3 weeks but has been worsening over the past week.  She reports that she did 2 episodes of vomiting due to excessive coughing yesterday.    She was evaluated urgent care this morning at 1000 and she was prescribed doxycycline for acute sinusitis.  Since then, she has had 2 doxycycline doses and reports no improvement of symptoms.  She was also provided dextromethorphan for cough and gentamicin for conjunctivitis.  Upon chart review, do not see any formal diagnosis of asthma, lung disease.  She denies known fevers, shortness of breath.  The history is provided by the patient. No language interpreter was used.  Sore Throat Pertinent negatives include no abdominal pain, no headaches and no shortness of breath.      Home Medications Prior to Admission medications   Medication Sig Start Date End Date Taking? Authorizing Provider  amphetamine-dextroamphetamine (ADDERALL) 30 MG tablet Take 1 tablet by mouth daily. 07/10/23   [provider]  cetirizine (ZYRTEC) 10 MG tablet Take 10 mg by mouth daily.    [provider]  diphenhydrAMINE (BENADRYL) 25 MG tablet Take 25 mg by mouth.    [provider]  doxycycline (VIBRAMYCIN) 100 MG capsule Take 1 capsule (100 mg total) by mouth 2 (two) times daily for 7 days. 07/26/23 08/02/23  Zenia Resides, MD  gentamicin (GARAMYCIN) 0.3 % ophthalmic solution Place 2 drops into both eyes 3 (three) times daily for 5 days. 07/26/23 07/31/23  Zenia Resides, MD  promethazine-dextromethorphan  (PROMETHAZINE-DM) 6.25-15 MG/5ML syrup Take 5 mLs by mouth 4 (four) times daily as needed for cough. 07/26/23   Zenia Resides, MD      Allergies    Iodine, Shellfish allergy, and Pollen extract    Review of Systems   Review of Systems  Constitutional:  Negative for chills, fatigue and fever.  HENT:  Positive for congestion, sinus pressure and sore throat. Negative for drooling, ear pain, facial swelling and sneezing.   Respiratory:  Positive for cough. Negative for chest tightness, shortness of breath and wheezing.        Pleuritic reproducible chest pain  Cardiovascular:  Negative for palpitations.  Gastrointestinal:  Negative for abdominal pain, constipation, diarrhea, nausea and vomiting.  Neurological:  Negative for dizziness, seizures, weakness, light-headedness, numbness and headaches.    Physical Exam Updated Vital Signs BP (!) 182/118 (BP Location: Left Arm)   Pulse (!) 106   Temp 98.4 F (36.9 C) (Oral)   Resp 18   LMP 07/24/2023 (Exact Date)   SpO2 100%  Physical Exam Vitals and nursing note reviewed.  Constitutional:      General: She is not in acute distress.    Appearance: Normal appearance. She is not ill-appearing or toxic-appearing.  HENT:     Head: Normocephalic and atraumatic.     Right Ear: Tympanic membrane, ear canal and external ear normal. Tympanic membrane is not erythematous.     Left Ear: Tympanic membrane, ear canal and external ear normal. Tympanic membrane is not erythematous.     Nose: Congestion  present.     Mouth/Throat:     Mouth: Mucous membranes are moist.     Pharynx: Uvula midline. No oropharyngeal exudate, posterior oropharyngeal erythema or uvula swelling.     Tonsils: Tonsillar exudate (Right worse than left) present. No tonsillar abscesses. 2+ on the right. 2+ on the left.     Comments: Mild posterior oropharyngeal erythrema Uvula midline. No abscess, fluctuance Eyes:     General: No scleral icterus.       Right eye: No  discharge.        Left eye: No discharge.     Conjunctiva/sclera: Conjunctivae normal.  Cardiovascular:     Rate and Rhythm: Tachycardia present.     Pulses: Normal pulses.  Pulmonary:     Effort: Pulmonary effort is normal. No respiratory distress.     Breath sounds: Normal breath sounds. No stridor. No wheezing or rhonchi.  Chest:     Chest wall: No tenderness.  Abdominal:     General: There is no distension.     Palpations: Abdomen is soft. There is no mass.     Tenderness: There is no abdominal tenderness. There is no guarding.  Musculoskeletal:     Cervical back: Normal range of motion and neck supple. No rigidity or tenderness.     Right lower leg: No edema.     Left lower leg: No edema.     Comments: Reproducible pleuritic chest pain that is worsened with cough, ventilation, and palpation of anterior chest wall  Lymphadenopathy:     Cervical: No cervical adenopathy.  Skin:    General: Skin is warm.     Capillary Refill: Capillary refill takes less than 2 seconds.     Coloration: Skin is not jaundiced or pale.     Comments: No rash noted  Neurological:     Mental Status: She is alert. Mental status is at baseline.    ED Results / Procedures / Treatments   Labs (all labs ordered are listed, but only abnormal results are displayed) Labs Reviewed - No data to display  EKG None  Radiology No results found.  Procedures Procedures    Medications Ordered in ED Medications - No data to display  ED Course/ Medical Decision Making/ A&P Clinical Course as of 07/26/23 2249  Thu Jul 26, 2023  2227 Upon reassessment at bedside, HR 90bpm, BP 160/110 [LB]    Clinical Course User Index [LB] Judithann Sheen, PA                                 Medical Decision Making Amount and/or Complexity of Data Reviewed Radiology: ordered.  Risk OTC drugs. Prescription drug management.   Patient presents to the ED for concern of sore throat, nasal congestion, pleuritic  chest pain for the past 3 weeks, this involves an extensive number of treatment options, and is a complaint that carries with it a high risk of complications and morbidity.  The differential diagnosis includes viral URI, URI progression to pneumonia, strep, mono   Co morbidities that complicate the patient evaluation  None   Additional history obtained:  Additional history obtained from Family, Nursing, and Outside Medical Records   External records from outside source obtained and reviewed including urgent care note from today Diagnosed with acute sinusitis, conjunctivitis.  Prescribed doxycycline, dextromethorphan, gentamicin Negative strep   Lab Tests:  I Ordered, and personally interpreted labs.  The pertinent results include:  Respiratory panel negative   Imaging Studies ordered:  I ordered imaging studies including chest x-ray I independently visualized and interpreted imaging which showed no acute cardiopulmonary pathology, pneumonia, nor pleural effusion I agree with the radiologist interpretation    Medicines ordered and prescription drug management:  I ordered medication including Toradol and Chloraseptic for pleuritic chest pain and throat pain Prescribed a short course of naproxen for pleuritic chest pain and generalized myalgia Reevaluation of the patient after these medicines showed that the patient improved I have reviewed the patients home medicines and have made adjustments as needed   Test Considered:  I considered obtaining an additional strep and culture however patient is currently on doxycycline and this would cover it.  She had a negative strep at urgent care this morning   Problem List / ED Course:  Sore throat Nasal congestion   Reevaluation:  After the interventions noted above, I reevaluated the patient and found that they have :improved   Dispostion:  Patient is tearful upon exam.  She complains of sore throat, pleuritic chest pain,  nasal congestion that has been present for 3 weeks and worsening over the past week.  She was seen at urgent care this morning and prescribed doxycycline for acute sinusitis to cover for strep although strep was negative this morning.  Vital signs significant for hypertension and tachycardia at 106 initially.  Saturation 100% with no fever.  She does not appear to be in acute signs of physical distress or shortness of breath.  See HPI.  Physical exam is significant for reproducible pleuritic chest pain that worsens with coughing, palpation, ventilation.  As this has been present for 3 weeks and reproducible, I do not feel that this is related to ACS.  There is mild posterior oropharyngeal erythema with tonsillar white exudates.  Tonsils are 2+ blood bilaterally.  Uvula is midline.  Patient is able to maintain her own secretions with no difficulty swallowing.  Lung sounds CTAB.  Will obtain chest x-ray as patient has had symptoms for over 3 weeks and respiratory panel.  Strep was negative this morning and provider at urgent care prescribed doxycycline for acute sinusitis and to cover for strep.  I do not feel that we should reswab for strep or obtain culture as it is being covered with antibiotic for sinusitis.  I considered mono however shared decision making is had regarding swabbing as it will not change management.  I discussed not sharing beverages with other individuals that she may spread what ever is causing her pharyngitis.  Will provide naproxen and Chloraseptic in ED for symptomatic management.  Respiratory panel negative chest x-ray negative for pneumonia, pleural effusion, acute cardiopulmonary disease.  Upon reassessment, patient reports significant improvement following pain management in ED.  Will provide prescription for naproxen and patient can take remaining Chloraseptic home for throat pain.  After consideration of the diagnostic results and the patients response to treatment, I feel that  the patent would benefit from outpatient management and follow-up with PCP following doxycycline completion in 1 week.  I discussed findings, disposition, return to my department precautions with patient expresses understanding agrees with plan.  Return to emergency department precautions include but not limited to difficulty or inability to swallow, maintain secretions, worsening of symptoms.  Expresses understanding and agrees with plan.        Final Clinical Impression(s) / ED Diagnoses Final diagnoses:  None    Rx / DC Orders ED Discharge Orders     None  Judithann Sheen, PA 07/26/23 2327    Maia Plan, MD 08/02/23 [phone]

## 2024-08-19 ENCOUNTER — Encounter (HOSPITAL_COMMUNITY): Payer: Self-pay

## 2024-08-19 ENCOUNTER — Ambulatory Visit (HOSPITAL_COMMUNITY)
Admission: EM | Admit: 2024-08-19 | Discharge: 2024-08-19 | Disposition: A | Discharge status: Home / Self Care | Attending: Family Medicine | Admitting: Family Medicine

## 2024-08-19 DIAGNOSIS — N3090 Cystitis, unspecified without hematuria: Secondary | ICD-10-CM

## 2024-08-19 DIAGNOSIS — N898 Other specified noninflammatory disorders of vagina: Secondary | ICD-10-CM

## 2024-08-19 LAB — POCT URINALYSIS DIP (MANUAL ENTRY)
Bilirubin, UA: NEGATIVE
Glucose, UA: NEGATIVE mg/dL
Nitrite, UA: NEGATIVE
Protein Ur, POC: 30 mg/dL — AB
Spec Grav, UA: 1.02 (ref 1.010–1.025)
Urobilinogen, UA: 1 U/dL
pH, UA: 5.5 (ref 5.0–8.0)

## 2024-08-19 MED ORDER — FLUCONAZOLE 150 MG PO TABS: ORAL_TABLET | ORAL | 0 refills | Status: AC

## 2024-08-19 MED ORDER — CEPHALEXIN 500 MG PO CAPS: 500.0000 mg | ORAL_CAPSULE | Freq: Two times a day (BID) | ORAL | 0 refills | Status: AC

## 2024-08-19 MED ORDER — METRONIDAZOLE 500 MG PO TABS: 500.0000 mg | ORAL_TABLET | Freq: Two times a day (BID) | ORAL | 0 refills | Status: AC

## 2024-08-19 NOTE — ED Triage Notes (Addendum)
 Patient reports that she has had vaginal itching and irritation x 2 weeks. Patient also reports a vaginal odor and cloudy urine.  Patient states she has used Monistat 3 day x 2 and Vagisil cream, but no relief.

## 2024-08-19 NOTE — ED Provider Notes (Signed)
 [facility]   [id number] 08/19/24 Arrival Time: 1518  ASSESSMENT & PLAN:  1. Vaginal discharge   2. Cystitis   Also feels she has a yeast infection. H/O BV.  Meds ordered this encounter  Medications   metroNIDAZOLE  (FLAGYL ) 500 MG tablet    Sig: Take 1 tablet (500 mg total) by mouth 2 (two) times daily.    Dispense:  14 tablet    Refill:  0   cephALEXin  (KEFLEX ) 500 MG capsule    Sig: Take 1 capsule (500 mg total) by mouth 2 (two) times daily.    Dispense:  10 capsule    Refill:  0   fluconazole  (DIFLUCAN ) 150 MG tablet    Sig: Take one tablet by mouth as a single dose. May repeat in 3 days if symptoms persist.    Dispense:  2 tablet    Refill:  0      Discharge Instructions      In addition to a urine culture, we have sent testing for various causes of vaginal infections. We will notify you of any positive results once they are received. If required, we will prescribe any medications you might need.  Please refrain from all sexual activity for at least the next seven days.     Without s/s of PID.  Labs Reviewed  POCT URINALYSIS DIP (MANUAL ENTRY) - Abnormal; Notable for the following components:      Result Value   Clarity, UA cloudy (*)    Ketones, POC UA trace (5) (*)    Blood, UA small (*)    Protein Ur, POC =30 (*)    Leukocytes, UA Large (3+) (*)    All other components within normal limits  URINE CULTURE    Pending: Unresulted Labs (From admission, onward)     Start     Ordered   08/19/24 1804  Urine Culture  Once,   URGENT       Question:  Indication  Answer:  Dysuria   08/19/24 1803             Will notify of any positive results. Instructed to refrain from sexual activity for at least seven days.  Reviewed expectations re: course of current medical issues. Questions answered. Outlined signs and symptoms indicating need for more acute intervention. Patient verbalized understanding. After Visit Summary  given.   SUBJECTIVE:  [name] is a 38 y.o. female who presents with complaint of vaginal discharge. Onset gradual. First noticed about 2 w ago. H/O B/V and yeast infection with similar symptoms. Does report dysuria over past week. Denies fever/abd pain/n/v. No tx PTA.  Patient's last menstrual period was 08/04/2024 (approximate).   OBJECTIVE:  Vitals:   08/19/24 1731  BP: (!) 158/101  Pulse: 92  Resp: 16  Temp: 99.1 F (37.3 C)  TempSrc: Oral  SpO2: 99%     General appearance: alert, cooperative, appears stated age and no distress Lungs: unlabored respirations; speaks full sentences without difficulty Back: no CVA tenderness; FROM at waist Abdomen: soft, non-tender GU: deferred Skin: warm and dry Psychological: alert and cooperative; normal mood and affect.  Results for orders placed or performed during the hospital encounter of 08/19/24  POCT urinalysis dipstick   Collection Time: 08/19/24  5:50 PM  Result Value Ref Range   Color, UA yellow yellow   Clarity, UA cloudy (A) clear   Glucose, UA negative negative mg/dL   Bilirubin, UA negative negative   Ketones, POC UA trace (5) (A)  negative mg/dL   Spec Grav, UA 8.979 8.989 - 1.025   Blood, UA small (A) negative   pH, UA 5.5 5.0 - 8.0   Protein Ur, POC =30 (A) negative mg/dL   Urobilinogen, UA 1.0 0.2 or 1.0 E.U./dL   Nitrite, UA Negative Negative   Leukocytes, UA Large (3+) (A) Negative    Labs Reviewed  POCT URINALYSIS DIP (MANUAL ENTRY) - Abnormal; Notable for the following components:      Result Value   Clarity, UA cloudy (*)    Ketones, POC UA trace (5) (*)    Blood, UA small (*)    Protein Ur, POC =30 (*)    Leukocytes, UA Large (3+) (*)    All other components within normal limits  URINE CULTURE    Allergies  Allergen Reactions   Iodine Anaphylaxis   Shellfish Allergy Anaphylaxis, Anxiety, Other (See Comments), Hives, Hypertension, Itching, Rash, Shortness Of Breath and Swelling     POSITIVE ALLERGY TEST   Pollen Extract Cough, Hives, Itching and Rash    Past Medical History:  Diagnosis Date   Acute sinusitis 01/24/2016   Chlamydia    age 6   Coughing/wheezing 01/24/2016   Hemorrhoids 11/13/2019   Recurrent sinus infections 04/12/2016   Seasonal allergies    Trichomonal vaginitis during pregnancy in first trimester 08/26/2019   TOC negative on 12/15   Family History  Problem Relation Age of Onset   Hypertension Mother    Hypertension Father    Diabetes Father    Hypertension Sister    Asthma Sister    Hypertension Brother    Asthma Brother    Asthma Daughter    Social History   Socioeconomic History   Marital status: Single    Spouse name: Not on file   Number of children: Not on file   Years of education: Not on file   Highest education level: Not on file  Occupational History   Not on file  Tobacco Use   Smoking status: Former    Current packs/day: 0.50    Average packs/day: 0.5 packs/day for 10.0 years (5.0 ttl pk-yrs)    Types: Cigarettes   Smokeless tobacco: Never  Vaping Use   Vaping status: Never Used  Substance and Sexual Activity   Alcohol use: No   Drug use: No   Sexual activity: Yes    Birth control/protection: None  Other Topics Concern   Not on file  Social History Narrative   Not on file   Social Drivers of Health   Financial Resource Strain: Not on file  Food Insecurity: Not on file  Transportation Needs: Not on file  Physical Activity: Not on file  Stress: Not on file  Social Connections: Unknown (01/24/2022)   Received from [facility]   Social Network    Social Network: Not on file  Intimate Partner Violence: Unknown (12/16/2021)   Received from [facility]   HITS    Physically Hurt: Not on file    Insult or Talk Down To: Not on file    Threaten Physical Harm: Not on file    Scream or Curse: Not on file           Rolinda Rogue, MD 08/19/24 [phone]

## 2024-08-19 NOTE — Discharge Instructions (Addendum)
In addition to a urine culture, we have sent testing for various causes of vaginal infections. We will notify you of any positive results once they are received. If required, we will prescribe any medications you might need.  Please refrain from all sexual activity for at least the next seven days.

## 2024-08-20 LAB — URINE CULTURE: Culture: >=100000 — AB

## 2024-08-21 ENCOUNTER — Ambulatory Visit (HOSPITAL_COMMUNITY): Payer: Self-pay
